# Patient Record
Sex: Female | Born: 1950
Health system: Southern US, Community
[De-identification: ages and names within clinical notes are randomized; demographics above are authoritative.]

## PROBLEM LIST (undated history)

## (undated) DIAGNOSIS — K219 Gastro-esophageal reflux disease without esophagitis: Secondary | ICD-10-CM

## (undated) HISTORY — DX: Gastro-esophageal reflux disease without esophagitis: K21.9

---

## 2002-12-07 ENCOUNTER — Other Ambulatory Visit: Admission: RE | Admit: 2002-12-07 | Discharge: 2002-12-07 | Payer: Self-pay | Admitting: Internal Medicine

## 2010-06-02 ENCOUNTER — Ambulatory Visit: Payer: Self-pay | Admitting: Family Medicine

## 2010-06-02 ENCOUNTER — Encounter: Payer: Self-pay | Admitting: Family Medicine

## 2010-06-02 DIAGNOSIS — R0789 Other chest pain: Secondary | ICD-10-CM | POA: Insufficient documentation

## 2010-06-02 DIAGNOSIS — K219 Gastro-esophageal reflux disease without esophagitis: Secondary | ICD-10-CM

## 2010-06-02 DIAGNOSIS — R21 Rash and other nonspecific skin eruption: Secondary | ICD-10-CM

## 2010-06-02 DIAGNOSIS — R0602 Shortness of breath: Secondary | ICD-10-CM

## 2010-06-02 DIAGNOSIS — Z780 Asymptomatic menopausal state: Secondary | ICD-10-CM | POA: Insufficient documentation

## 2010-06-02 LAB — CONVERTED CEMR LAB
Blood in Urine, dipstick: NEGATIVE
Glucose, Urine, Semiquant: NEGATIVE
Ketones, urine, test strip: NEGATIVE
Specific Gravity, Urine: 1.025
pH: 5

## 2010-06-04 ENCOUNTER — Telehealth (INDEPENDENT_AMBULATORY_CARE_PROVIDER_SITE_OTHER): Payer: Self-pay | Admitting: *Deleted

## 2010-06-04 LAB — CONVERTED CEMR LAB
Albumin: 4.4 g/dL (ref 3.5–5.2)
Alkaline Phosphatase: 66 units/L (ref 39–117)
Anti Nuclear Antibody(ANA): NEGATIVE
Basophils Absolute: 0 10*3/uL (ref 0.0–0.1)
CO2: 28 meq/L (ref 19–32)
Calcium: 9.8 mg/dL (ref 8.4–10.5)
Creatinine, Ser: 0.7 mg/dL (ref 0.4–1.2)
Eosinophils Absolute: 0.2 10*3/uL (ref 0.0–0.7)
Glucose, Bld: 96 mg/dL (ref 70–99)
HDL: 53.4 mg/dL (ref >39.00)
Hemoglobin: 12.8 g/dL (ref 12.0–15.0)
Hgb A1c MFr Bld: 6.2 % (ref 4.6–6.5)
Lymphocytes Relative: 34.1 % (ref 12.0–46.0)
MCHC: 34.5 g/dL (ref 30.0–36.0)
MCV: 87.5 fL (ref 78.0–100.0)
Monocytes Absolute: 0.5 10*3/uL (ref 0.1–1.0)
Neutro Abs: 4.7 10*3/uL (ref 1.4–7.7)
RDW: 13.8 % (ref 11.5–14.6)
Sed Rate: 4 mm/hr (ref 0–22)
Sodium: 141 meq/L (ref 135–145)
TSH: 1.63 microintl units/mL (ref 0.35–5.50)
Triglycerides: 101 mg/dL (ref 0.0–149.0)

## 2010-06-10 ENCOUNTER — Encounter: Payer: Self-pay | Admitting: Family Medicine

## 2010-06-10 ENCOUNTER — Encounter: Admission: RE | Admit: 2010-06-10 | Discharge: 2010-06-10 | Payer: Self-pay | Admitting: Family Medicine

## 2010-06-29 ENCOUNTER — Encounter: Payer: Self-pay | Admitting: Family Medicine

## 2010-06-30 ENCOUNTER — Telehealth (INDEPENDENT_AMBULATORY_CARE_PROVIDER_SITE_OTHER): Payer: Self-pay | Admitting: *Deleted

## 2010-07-03 ENCOUNTER — Ambulatory Visit: Payer: Self-pay | Admitting: Family Medicine

## 2010-07-03 DIAGNOSIS — R030 Elevated blood-pressure reading, without diagnosis of hypertension: Secondary | ICD-10-CM

## 2010-07-03 DIAGNOSIS — E785 Hyperlipidemia, unspecified: Secondary | ICD-10-CM | POA: Insufficient documentation

## 2010-07-09 ENCOUNTER — Encounter: Payer: Self-pay | Admitting: Family Medicine

## 2010-07-09 ENCOUNTER — Ambulatory Visit: Payer: Self-pay | Admitting: Family Medicine

## 2010-07-10 ENCOUNTER — Encounter: Payer: Self-pay | Admitting: Family Medicine

## 2010-07-17 ENCOUNTER — Telehealth (INDEPENDENT_AMBULATORY_CARE_PROVIDER_SITE_OTHER): Payer: Self-pay | Admitting: *Deleted

## 2010-08-14 ENCOUNTER — Encounter: Payer: Self-pay | Admitting: Family Medicine

## 2010-09-01 NOTE — Assessment & Plan Note (Signed)
Summary: Review [facility]//KP   Vital Signs:  Patient profile:   60 year old female Menstrual status:  postmenopausal Weight:      196.9 pounds Pulse rate:   80 / minute Pulse rhythm:   regular BP sitting:   120 / 88  (left arm) Cuff size:   large  Vitals Entered By: Almeta Monas CMA Duncan Dull) (July 03, 2010 3:10 PM) CC: Review [facility]   History of Present Illness: Pt here to review labs.    Current Medications (verified): 1)  None  Allergies (verified): No Known Drug Allergies  Physical Exam  General:  Well-developed,well-nourished,in no acute distress; alert,appropriate and cooperative throughout examination Psych:  Cognition and judgment appear intact. Alert and cooperative with normal attention span and concentration. No apparent delusions, illusions, hallucinations   Impression & Recommendations:  Problem # 1:  HYPERLIPIDEMIA (ICD-272.4) see [facility] diet and exercise---recheck 3-6 months Labs Reviewed: SGOT: 29 (06/02/2010)   SGPT: 35 (06/02/2010)   HDL:53.40 (06/02/2010)  LDL:124 (06/02/2010)  Chol:198 (06/02/2010)  Trig:101.0 (06/02/2010)  Problem # 2:  Family Hx of CARNITINE DEFIC DUE INBORN ERRORS METABOLISM (ICD-277.82)  Orders: Venipuncture (28413) T- * Misc. Laboratory test [phone]) T- * Misc. Laboratory test [phone]) T- * Misc. Laboratory test [phone])  Problem # 3:  ELEVATED BLOOD PRESSURE WITHOUT DIAGNOSIS OF HYPERTENSION (ICD-796.2)  BP today: 120/88 Prior BP: 114/76 (06/02/2010)  Labs Reviewed: Creat: 0.7 (06/02/2010) Chol: 198 (06/02/2010)   HDL: 53.40 (06/02/2010)   LDL: 124 (06/02/2010)   TG: 101.0 (06/02/2010)  Instructed in low sodium diet (DASH Handout) and behavior modification.    Patient Instructions: 1)  Please schedule a follow-up appointment in 2 weeks.    Orders Added: 1)  Venipuncture [36415] 2)  T- * Misc. Laboratory test [99999] 3)  T- * Misc. Laboratory test [99999] 4)  T- * Misc. Laboratory  test [99999] 5)  Est. Patient Level III [99213]  Appended Document: Orders Update    Clinical Lists Changes  Orders: Added new Test order of T-Vitamin D (25-Hydroxy) [phone]) - Signed

## 2010-09-01 NOTE — Progress Notes (Signed)
Summary: Results 11/3  Phone Note Outgoing Call   Call placed by: Almeta Monas CMA Duncan Dull),  June 04, 2010 9:45 AM Call placed to: Patient Details for Reason: Results Summary of Call: negative for Lupus When [facility] come in we will have pt come in to discuss results......  Left message to call back.........................................Marland KitchenAlmeta Monas CMA Duncan Dull)  June 04, 2010 9:44 AM  Gave pt the results and advise we will schedule her to review [facility] when they come back, Pt voiced understanding.... Almeta Monas CMA Duncan Dull)  June 05, 2010 9:18 AM

## 2010-09-01 NOTE — Assessment & Plan Note (Signed)
Summary: new to est//requesting cpx//lch   Vital Signs:  Patient profile:   60 year old female Menstrual status:  postmenopausal Height:      61.75 inches Weight:      199.0 pounds BMI:     36.83 Temp:     97.0 degrees F oral Pulse rate:   80 / minute Pulse rhythm:   regular BP sitting:   114 / 76  (left arm) Cuff size:   large  Vitals Entered By: Almeta Monas CMA Duncan Dull) (June 02, 2010 10:12 AM) CC: New Est Care/Fasting Declined flu shot and Tdap---Burning sesnsation across ther chest Is Patient Diabetic? No     Menstrual Status postmenopausal   History of Present Illness: Pt here to establish and c/o pinpoint burning sensation across chest ---last episode was september.  She has not had anything since then.   Pt states she has not had any since she changed her diet.   Pt had some SOB with it but she has trouble breathing in the humid weather --it doesn't matter if she is sitting , standing or walking the humidity bothers her every summer.   Pt also c/o itchy face and ears- and PND.  Pt has tried claritin only---years ago.   Preventive Screening-Counseling & Management  Alcohol-Tobacco     Smoking Status: never  Caffeine-Diet-Exercise     Caffeine use/day: 2 cups per day     Does Patient Exercise: no      Drug Use:  no.    Current Medications (verified): 1)  None  Allergies (verified): No Known Drug Allergies  Past History:  Family History: Last updated: 06/02/2010 Family History Lung cancer-- Father (smoker) Family History of Cardiovascular disorder--Mother Family History Diabetes 1st degree relative Family History Hypertension  Social History: Last updated: 06/02/2010 Married Never Smoked Alcohol use-no Drug use-no Regular exercise-no  Risk Factors: Caffeine Use: 2 cups per day (06/02/2010) Exercise: no (06/02/2010)  Risk Factors: Smoking Status: never (06/02/2010)  Past Medical History: GERD  Family History: Reviewed history and no  changes required. Family History Lung cancer-- Father (smoker) Family History of Cardiovascular disorder--Mother Family History Diabetes 1st degree relative Family History Hypertension  Social History: Reviewed history and no changes required. Married Never Smoked Alcohol use-no Drug use-no Regular exercise-no Smoking Status:  never Drug Use:  no Does Patient Exercise:  no Caffeine use/day:  2 cups per day  Review of Systems      See HPI General:  Denies chills, fatigue, fever, loss of appetite, malaise, sleep disorder, sweats, weakness, and weight loss. Eyes:  Denies blurring, discharge, double vision, eye irritation, eye pain, halos, itching, light sensitivity, red eye, vision loss-1 eye, and vision loss-both eyes. ENT:  Denies decreased hearing, difficulty swallowing, ear discharge, earache, hoarseness, nasal congestion, nosebleeds, postnasal drainage, ringing in ears, sinus pressure, and sore throat; itchy ears. CV:  Denies bluish discoloration of lips or nails, chest pain or discomfort, difficulty breathing at night, difficulty breathing while lying down, fainting, fatigue, leg cramps with exertion, lightheadness, near fainting, palpitations, shortness of breath with exertion, swelling of feet, swelling of hands, and weight gain. Resp:  Complains of shortness of breath; denies chest discomfort, chest pain with inspiration, cough, coughing up blood, excessive snoring, hypersomnolence, morning headaches, pleuritic, sputum productive, and wheezing. GI:  Denies abdominal pain, bloody stools, change in bowel habits, constipation, dark tarry stools, diarrhea, excessive appetite, gas, hemorrhoids, indigestion, loss of appetite, nausea, vomiting, vomiting blood, and yellowish skin color. GU:  Denies abnormal vaginal bleeding,  decreased libido, discharge, dysuria, genital sores, hematuria, incontinence, nocturia, urinary frequency, and urinary hesitancy. MS:  Denies joint pain, joint redness,  joint swelling, loss of strength, low back pain, mid back pain, muscle aches, muscle , cramps, muscle weakness, stiffness, and thoracic pain. Derm:  Complains of changes in color of skin and itching; denies changes in nail beds, dryness, excessive perspiration, flushing, hair loss, insect bite(s), lesion(s), poor wound healing, and rash. Neuro:  Denies brief paralysis, difficulty with concentration, disturbances in coordination, falling down, headaches, inability to speak, memory loss, numbness, poor balance, seizures, sensation of room spinning, tingling, tremors, visual disturbances, and weakness. Psych:  Denies alternate hallucination ( auditory/visual), anxiety, depression, easily angered, easily tearful, irritability, mental problems, panic attacks, sense of great danger, suicidal thoughts/plans, thoughts of violence, unusual visions or sounds, and thoughts /plans of harming others. Endo:  Denies cold intolerance, excessive hunger, excessive thirst, excessive urination, heat intolerance, polyuria, and weight change. Heme:  Denies abnormal bruising, bleeding, enlarge lymph nodes, fevers, pallor, and skin discoloration. Allergy:  Denies hives or rash, itching eyes, persistent infections, seasonal allergies, and sneezing.  Physical Exam  General:  Well-developed,well-nourished,in no acute distress; alert,appropriate and cooperative throughout examinationoverweight-appearing.   Head:  Normocephalic and atraumatic without obvious abnormalities. No apparent alopecia or balding. Eyes:  pupils equal, pupils round, and pupils reactive to light.   Ears:  External ear exam shows no significant lesions or deformities.  Otoscopic examination reveals clear canals, tympanic membranes are intact bilaterally without bulging, retraction, inflammation or discharge. Hearing is grossly normal bilaterally. Nose:  External nasal examination shows no deformity or inflammation. Nasal mucosa are pink and moist without  lesions or exudates. Mouth:  Oral mucosa and oropharynx without lesions or exudates.  Teeth in good repair. Neck:  No deformities, masses, or tenderness noted. Lungs:  Normal respiratory effort, chest expands symmetrically. Lungs are clear to auscultation, no crackles or wheezes. Heart:  normal rate and no murmur.   Extremities:  No clubbing, cyanosis, edema, or deformity noted with normal full range of motion of all joints.   Neurologic:  alert & oriented X3 and gait normal.   Skin:  face red + papules forehead, cheeks and chin mult SK Psych:  Cognition and judgment appear intact. Alert and cooperative with normal attention span and concentration. No apparent delusions, illusions, hallucinations   Impression & Recommendations:  Problem # 1:  SHORTNESS OF BREATH (ICD-786.05) PFT normal pt states breathing normal now--rto when weather gets warmer or symptoms start to reevaluate--- suspect pt has allergic component---secondary to itchy ears and drainage Orders: Venipuncture (44034) TLB-BMP (Basic Metabolic Panel-BMET) (80048-METABOL) TLB-CBC Platelet - w/Differential (85025-CBCD) TLB-Hepatic/Liver Function Pnl (80076-HEPATIC) TLB-TSH (Thyroid Stimulating Hormone) (84443-TSH) TLB-A1C / Hgb A1C (Glycohemoglobin) (83036-A1C) EKG w/ Interpretation (93000) Spirometry w/Graph (94010)  Problem # 2:  CHEST PAIN, ATYPICAL (ICD-786.59) none since Sept and change in diet check labs , rto prn Orders: Venipuncture (74259) TLB-BMP (Basic Metabolic Panel-BMET) (80048-METABOL) TLB-CBC Platelet - w/Differential (85025-CBCD) TLB-Hepatic/Liver Function Pnl (80076-HEPATIC) TLB-TSH (Thyroid Stimulating Hormone) (84443-TSH) TLB-A1C / Hgb A1C (Glycohemoglobin) (83036-A1C) EKG w/ Interpretation (93000) Spirometry w/Graph (94010)  Problem # 3:  FACIAL RASH (ICD-782.1) pt did not want to try any abx or lotions--she will wait until she sees derm  Orders: Dermatology Referral (Derma) T-Antinuclear  Antib (ANA) [phone]) TLB-Sedimentation Rate (ESR) (85652-ESR) EKG w/ Interpretation (93000) Spirometry w/Graph (94010)  Discussed medication use and symptom control.   Other Orders: T- * Misc. Laboratory test [phone]) Radiology Referral (Radiology)  Patient Instructions: 1)  try zyrtec daily 2)  rto if symptoms return   Orders Added: 1)  Venipuncture [36415] 2)  TLB-BMP (Basic Metabolic Panel-BMET) [80048-METABOL] 3)  TLB-CBC Platelet - w/Differential [85025-CBCD] 4)  TLB-Hepatic/Liver Function Pnl [80076-HEPATIC] 5)  TLB-TSH (Thyroid Stimulating Hormone) [84443-TSH] 6)  TLB-A1C / Hgb A1C (Glycohemoglobin) [83036-A1C] 7)  Dermatology Referral [Derma] 8)  T-Antinuclear Antib (ANA) [[address]] 9)  TLB-Sedimentation Rate (ESR) [85652-ESR] 10)  T- * Misc. Laboratory test [phone] 11)  Radiology Referral [Radiology] 12)  New Patient Level III [99203] 13)  EKG w/ Interpretation [93000] 14)  Spirometry w/Graph [94010]     Laboratory Results   Urine Tests   Date/Time Reported: June 02, 2010 12:20 PM  Routine Urinalysis   Color: yellow Appearance: Clear Glucose: negative   (Normal Range: Negative) Bilirubin: negative   (Normal Range: Negative) Ketone: negative   (Normal Range: Negative) Spec. Gravity: 1.025   (Normal Range: 1.003-1.035) Blood: negative   (Normal Range: Negative) pH: 5.0   (Normal Range: 5.0-8.0) Protein: negative   (Normal Range: Negative) Urobilinogen: 0.2   (Normal Range: 0-1) Nitrite: negative   (Normal Range: Negative) Leukocyte Esterace: negative   (Normal Range: Negative)

## 2010-09-01 NOTE — Progress Notes (Signed)
Summary: Needs appt 11/29  Phone Note Outgoing Call   Call placed by: Almeta Monas CMA Duncan Dull),  June 30, 2010 9:52 AM Call placed to: Patient Details for Reason: Needs Appt  Summary of Call: Pt needs appt to review [facility]... Left message to call back  Initial call taken by: Almeta Monas CMA Duncan Dull),  June 30, 2010 9:52 AM  Follow-up for Phone Call        appt scheduled.... Almeta Monas CMA Duncan Dull)  July 01, 2010 2:57 PM

## 2010-09-03 NOTE — Progress Notes (Signed)
Summary: Vit D results 12/16,12/19  Phone Note Outgoing Call   Call placed by: Almeta Monas CMA Duncan Dull),  July 17, 2010 4:25 PM Call placed to: Patient Details for Reason: Vitamin D results Summary of Call: Vit D low. pt will need 50,000 IU of Vit-D 1 by mouth wkly #4 with 2  rf and also take 2000 IU daily. recheck Vit D in 3 mos.   Left message to call back... Almeta Monas CMA Duncan Dull)  July 17, 2010 4:26 PM  Mailbox full, unable to leave mssg.... Almeta Monas CMA Duncan Dull)  July 20, 2010 3:56 PM   pt aware of the above and stated she got the order for the urine test... Rx called to [facility] in [place].  Almeta Monas CMA Duncan Dull)  July 21, 2010 4:34 PM'    New/Updated Medications: VITAMIN D (ERGOCALCIFEROL) 50000 UNIT CAPS (ERGOCALCIFEROL) 1 by mouth weekly x4 weeks Prescriptions: VITAMIN D (ERGOCALCIFEROL) 50000 UNIT CAPS (ERGOCALCIFEROL) 1 by mouth weekly x4 weeks  #4 x 2   Entered by:   Almeta Monas CMA (AAMA)   Authorized by:   Loreen Freud DO   Signed by:   Almeta Monas CMA (AAMA) on 07/21/2010   Method used:   Electronically to        [facility]* (retail)       [address] [place], [place]  [address]       Ph: [phone]       Fax: [phone]   RxID:   [id number]

## 2010-09-03 NOTE — Assessment & Plan Note (Signed)
Summary: BP CHECK/KN  Nurse Visit   Vital Signs:  Patient profile:   60 year old female Menstrual status:  postmenopausal Height:      61.75 inches Weight:      194 pounds BMI:     35.90 Temp:     97.5 degrees F oral Pulse rate:   82 / minute BP sitting:   118 / 82  (left arm)  Vitals Entered By: Jeremy Johann CMA (July 09, 2010 3:59 PM) CC: BP Check   Current Medications (verified): 1)  None  Allergies (verified): No Known Drug Allergies  Appended Document: Orders Update    Clinical Lists Changes  Orders: Added new Service order of Est. Patient Level I ([address]) - Signed

## 2010-09-03 NOTE — Consult Note (Signed)
Summary: Duard Larsen MD Dermatology  Duard Larsen MD Dermatology   Imported By: Lanelle Bal 07/23/2010 09:28:17  _____________________________________________________________________  External Attachment:    Type:   Image     Comment:   External Document

## 2011-02-07 ENCOUNTER — Encounter: Payer: Self-pay | Admitting: Family Medicine

## 2011-02-12 ENCOUNTER — Ambulatory Visit (INDEPENDENT_AMBULATORY_CARE_PROVIDER_SITE_OTHER): Payer: Managed Care, Other (non HMO) | Admitting: Family Medicine

## 2011-02-12 ENCOUNTER — Encounter: Payer: Self-pay | Admitting: Family Medicine

## 2011-02-12 DIAGNOSIS — R1011 Right upper quadrant pain: Secondary | ICD-10-CM

## 2011-02-12 DIAGNOSIS — L719 Rosacea, unspecified: Secondary | ICD-10-CM | POA: Insufficient documentation

## 2011-02-12 DIAGNOSIS — Z1211 Encounter for screening for malignant neoplasm of colon: Secondary | ICD-10-CM

## 2011-02-12 DIAGNOSIS — R0602 Shortness of breath: Secondary | ICD-10-CM

## 2011-02-12 NOTE — Progress Notes (Signed)
Subjective:    Patient ID: Gina Norton, female    DOB: May 25, 1951, 60 y.o.   MRN: [id number]  HPI  She had labs done last Nov, 2011 and sent to [facility]. She would like those redrawn. I did go into her old records and reviewed her results. Her insulin levels are high. She notes she reallly changed her diet and she has lost 20 lbs since then. She had several excusses for why she is not exercising but is frustrated that her weight has plateued.    Has right sided pain that started during pregnancy. Feels almost like a baby is kicking. Not painful.  Just uncomfortable. Happening more often. Has lost 20 lbs with dietary changes.  Tight clothes irritate it. No pain today.  Took Ibu - it completely resolves her sxs.    She complains of shortness of breath mostly when she's at [facility]. She says it will feel like she has a hard time breathing and she has mentally been without taking a breath in. She says at times she finishes her shopping she feels completely and utterly exhausted and feels like it messes with her breathing pattern. She she says it is alleviated when checked she walks out of the building where it is a little bit warmer and he gets even better when she sits in her car and was returned on the air-conditioning. She denies any prior history of allergies. She did state she had a breathing test which I assume a spirometry done at her previous physician's office which she reports was normal. There is no rhinitis sneezing or itching with these episodes. No cough, shortness of breath.  Review of Systems  Constitutional: Negative for fever, diaphoresis and unexpected weight change.  HENT: Negative for hearing loss, sneezing, postnasal drip and tinnitus.   Eyes: Negative for visual disturbance.  Respiratory: Negative for cough and wheezing.   Cardiovascular: Negative for chest pain and palpitations.  Gastrointestinal: Negative for nausea, vomiting and blood in stool.  Genitourinary:  Negative for vaginal bleeding and vaginal discharge.  Musculoskeletal: Negative for arthralgias.  Skin: Negative for rash.  Neurological: Negative for headaches.  Hematological: Negative for adenopathy. Does not bruise/bleed easily.  Psychiatric/Behavioral: Negative for dysphoric mood. The patient is not nervous/anxious.    BP 121/80  Pulse 60  Ht 5\' 3"  (1.6 m)  Wt 180 lb (81.647 kg)  BMI 31.89 kg/m2  SpO2 94%    Not on File  Past Medical History  Diagnosis Date  . GERD (gastroesophageal reflux disease)     No past surgical history on file.  History   Social History  . Marital Status: Married    Spouse Name: Jesus     Number of Children: 4  . Years of Education: N/A   Occupational History  . Homemaker    Social History Main Topics  . Smoking status: Never Smoker   . Smokeless tobacco: Not on file  . Alcohol Use: No  . Drug Use: No  . Sexually Active: Yes -- Female partner(s)   Other Topics Concern  . Not on file   Social History Narrative   4 caffeine drink a day. Walks for exercise.     Family History  Problem Relation Age of Onset  . Lung cancer Father 40    smoker  . Heart disease Mother     family history  . Diabetes Maternal Grandmother   . Hypertension Maternal Grandfather   . Lung cancer Paternal Grandfather   . Thyroid cancer Son   .  Leukemia Grandchild     No current outpatient prescriptions on file.     Objective:   Physical Exam  Constitutional: She is oriented to person, place, and time. She appears well-developed and well-nourished.       Obese.   HENT:  Head: Normocephalic and atraumatic.  Cardiovascular: Normal rate, regular rhythm and normal heart sounds.   Pulmonary/Chest: Effort normal and breath sounds normal.  Neurological: She is alert and oriented to person, place, and time.  Skin: Skin is warm and dry.  Psychiatric: She has a normal mood and affect. Her behavior is normal.          Assessment & Plan:  Right sided  flank pain-Discussed not likely to be GB or liver since not really painful. If feels like moving then most likely cause with be her bowels. She has never had colonoscopy. We dicussed getting this scheduled. Otherwise unlikely to be a cancer, etc if has ben there for that many years.    SOB - Likely anxiety esp if has had normal spirometry. I expalined to her that it is strange that her breathing is better once leaves the store and step outside. Certainly some of the fumes frmo clothing, packaging and products may be irritating her but the only solutoin to that is to not shop there.  I really don't think this is allergic but can try an antihistamine before shopping and see if helps her . Can try claritin. She says her previous MD wanted her to see an allergies.

## 2011-02-12 NOTE — Patient Instructions (Signed)
We will call you when we are able to order the [place] test.

## 2011-02-17 ENCOUNTER — Telehealth: Payer: Self-pay | Admitting: Family Medicine

## 2011-02-17 NOTE — Telephone Encounter (Signed)
Patient walk-in request to know if she can have a [facility] test done Friday. Pt states that she spk with you 02/12/11 about having test done  And you were not sure where to order the test. Pt request to know if you can give her a lab order that she can take to Dr.Lowne's office to have done this Friday since she has to take her husband to an office visit near Dr. Ernst Spell office. Pt states can come pick up lab order tomorrow if someone will call her tomorrow afternoon and adv it is ready for pick up

## 2011-02-18 NOTE — Telephone Encounter (Signed)
Advised pt to stop in 7/20 to pick up Rx for test per Dr Judie Petit.

## 2011-02-18 NOTE — Telephone Encounter (Signed)
I don't have a way to put in formal labs order but I can write on a rx pad and she can pick up and take with her. Will leave up front for her.

## 2011-02-22 ENCOUNTER — Other Ambulatory Visit (INDEPENDENT_AMBULATORY_CARE_PROVIDER_SITE_OTHER): Payer: Managed Care, Other (non HMO)

## 2011-02-22 DIAGNOSIS — E785 Hyperlipidemia, unspecified: Secondary | ICD-10-CM

## 2011-02-22 NOTE — Progress Notes (Signed)
,  12  

## 2011-03-13 ENCOUNTER — Other Ambulatory Visit: Payer: Self-pay | Admitting: Family Medicine

## 2011-03-22 ENCOUNTER — Telehealth: Payer: Self-pay | Admitting: Family Medicine

## 2011-03-22 NOTE — Telephone Encounter (Signed)
Call pt: I have her test results from [facility]. She can make appt to review anytime.

## 2011-03-23 NOTE — Telephone Encounter (Signed)
Pt.notified

## 2011-04-01 ENCOUNTER — Ambulatory Visit (INDEPENDENT_AMBULATORY_CARE_PROVIDER_SITE_OTHER): Payer: Managed Care, Other (non HMO) | Admitting: Family Medicine

## 2011-04-01 ENCOUNTER — Encounter: Payer: Self-pay | Admitting: Family Medicine

## 2011-04-01 VITALS — BP 126/78 | HR 68 | Ht 63.0 in | Wt 182.0 lb

## 2011-04-01 DIAGNOSIS — Z1211 Encounter for screening for malignant neoplasm of colon: Secondary | ICD-10-CM

## 2011-04-01 NOTE — Progress Notes (Signed)
  Subjective:    Patient ID: Gina Norton, female    DOB: 1951/04/29, 60 y.o.   MRN: [id number]  HPI Has been working on diet. No regular exercise. SHe is here to review labs done through the [facility] program . She had these done last November.   We reviewed her results. She did have some improvement especially in her insulin score. It went from 25 down to 10. Her A1c also went down from 6.1-5.8. Her CRP went down from 7.2-6.6. Her LDL is still a little elevated at 124 and her triglycerides look good. Her HDL is still around 54. It actually did drop a little bit.   Review of Systems     Objective:   Physical Exam  Constitutional: She is oriented to person, place, and time. She appears well-developed and well-nourished.  Cardiovascular: Normal rate, regular rhythm and normal heart sounds.   Pulmonary/Chest: Effort normal and breath sounds normal.  Neurological: She is alert and oriented to person, place, and time.  Skin: Skin is warm and dry.  Psychiatric: She has a normal mood and affect.          Assessment & Plan:  We discussed the importance of colon cancer screening. She has never had this done. She is ok with referral. Her daughter is here with her today. She delcined the flu vaccine today.    We reviewed her lab results. Please see above. We discussed the importance of adding regular exercise in addition to her dietary changes. Certainly that has made a good stride in the right direction and if she were actually start exercising she could probably actually get her LDL down to 100 and increase her HDL. Also her CRP would probably improve mildly to I. suspect she'll probably be unlikely to get under 2. And she may be able to get her A1c which is now 5.8 under 5.7 which would put her into the normal range. Her liver function and kidney function were normal.

## 2011-04-01 NOTE — Patient Instructions (Signed)
We will call you with the referral. 

## 2011-04-26 ENCOUNTER — Encounter: Payer: Self-pay | Admitting: Family Medicine

## 2013-01-02 ENCOUNTER — Ambulatory Visit (INDEPENDENT_AMBULATORY_CARE_PROVIDER_SITE_OTHER): Payer: BC Managed Care – PPO | Admitting: Family Medicine

## 2013-01-02 ENCOUNTER — Ambulatory Visit (INDEPENDENT_AMBULATORY_CARE_PROVIDER_SITE_OTHER): Payer: BC Managed Care – PPO

## 2013-01-02 ENCOUNTER — Encounter: Payer: Self-pay | Admitting: Family Medicine

## 2013-01-02 VITALS — BP 128/88 | HR 76 | Ht 62.0 in | Wt 189.0 lb

## 2013-01-02 DIAGNOSIS — R0789 Other chest pain: Secondary | ICD-10-CM

## 2013-01-02 DIAGNOSIS — E785 Hyperlipidemia, unspecified: Secondary | ICD-10-CM

## 2013-01-02 DIAGNOSIS — E669 Obesity, unspecified: Secondary | ICD-10-CM

## 2013-01-02 DIAGNOSIS — R109 Unspecified abdominal pain: Secondary | ICD-10-CM

## 2013-01-02 DIAGNOSIS — Z8249 Family history of ischemic heart disease and other diseases of the circulatory system: Secondary | ICD-10-CM | POA: Insufficient documentation

## 2013-01-02 DIAGNOSIS — E039 Hypothyroidism, unspecified: Secondary | ICD-10-CM

## 2013-01-02 LAB — POCT URINALYSIS DIPSTICK
Bilirubin, UA: NEGATIVE
Blood, UA: NEGATIVE
Glucose, UA: NEGATIVE
Nitrite, UA: NEGATIVE
Urobilinogen, UA: 1

## 2013-01-02 NOTE — Progress Notes (Signed)
Subjective:    Patient ID: Gina Norton, female    DOB: 05-14-1951, 62 y.o.   MRN: [id number]  HPI She's not been seen in our office in over 3 years and she is here to establish care today. She does have some very specific concerns including chest pain and flank pain.  Had some burning on left side of chest pain over teh weekend. Her husband had a birthday this weekend and was under a lot of stress. Says it was a burning sensation. Says went into her armpit and maybe radiated into he neck. Says had similar sxs 3 years ago. Hasn't recurred. No nausea with it.  Some sweaty with it but says was hot in the house. Lasted 30 min. Says this felt different than her GERD. Mother had MI at age 66.  Never had a stress test.  No SOB with activity.    She's also noticed some right flank pain on and off for the last year. She says it seems to be worse if she leans to the right and sort of pinches the area. It is nontender to touch. But will ache at times. She denies any hematuria. She seems a bit aggravated by twisting motions. Sclera with rest. She denies any known injury to her back. No radiation into her buttocks or into her legs. No numbness or tingling in the legs. No prior history of kidney stones.  Review of Systems She also complains of some bilateral hip pain.  BP 128/88  Pulse 76  Ht 5\' 2"  (1.575 m)  Wt 189 lb (85.73 kg)  BMI 34.56 kg/m2    No Known Allergies  Past Medical History  Diagnosis Date  . GERD (gastroesophageal reflux disease)     No past surgical history on file.  History   Social History  . Marital Status: Married    Spouse Name: Jesus     Number of Children: 4  . Years of Education: N/A   Occupational History  . Homemaker    Social History Main Topics  . Smoking status: Never Smoker   . Smokeless tobacco: Not on file  . Alcohol Use: No  . Drug Use: No  . Sexually Active: Yes -- Female partner(s)   Other Topics Concern  . Not on file   Social History Narrative    4 caffeine drink a day. Walks for exercise.     Family History  Problem Relation Age of Onset  . Lung cancer Father 31    smoker  . Heart disease Mother     family history  . Diabetes Maternal Grandmother   . Hypertension Maternal Grandfather   . Lung cancer Paternal Grandfather   . Thyroid cancer Son   . Leukemia Grandchild     Outpatient Encounter Prescriptions as of 01/02/2013  Medication Sig Dispense Refill  . Cholecalciferol (D3-1000) 1000 UNITS capsule Take 2,000 Units by mouth daily.      Marland Kitchen doxycycline (ORACEA) 40 MG capsule Take 40 mg by mouth every morning.      . Magnesium 250 MG TABS Take 1 tablet by mouth.      . vitamin C (ASCORBIC ACID) 500 MG tablet Take 1,000 mg by mouth daily.       No facility-administered encounter medications on file as of 01/02/2013.          Objective:   Physical Exam  Constitutional: She is oriented to person, place, and time. She appears well-developed and well-nourished.  HENT:  Head: Normocephalic and  atraumatic.  Neck: Neck supple. No thyromegaly present.  Cardiovascular: Normal rate, regular rhythm and normal heart sounds.   No abdominal bruits.  Pulmonary/Chest: Effort normal and breath sounds normal.  Abdominal: Soft. Bowel sounds are normal. She exhibits no distension and no mass. There is tenderness. There is no rebound and no guarding.  Tender suprapubically  Musculoskeletal: She exhibits no edema.  Lymphadenopathy:    She has no cervical adenopathy.  Neurological: She is alert and oriented to person, place, and time.  Skin: Skin is warm and dry.  Psychiatric: She has a normal mood and affect. Her behavior is normal.          Assessment & Plan:   Atypical chest pain - consider cardiac causes. She does have a strong family history of heart disease that she is overall low risk for self. She does not have high blood pressure. She does have hyperlipidemia. No prior history of diabetes etc. certainly women can present  in a typical fashion. She says this definitely felt different from her typical reflux which is more mid chest and this was definitely to the left. EKG shows inverted T wave in lead 1. Right axis deviation. No acute ST-T wave changes. Poor R wave progression. Will get a chest x-ray as well as check a CK and troponin. Would also like to refer her for a treadmill stress test.  I would like to schedule her for a stress test. I would also like to get up-to-date blood work and look at her lipids and glucose.  Right flank pain-most likely musculoskeletal. Consider renal causes. We'll check a urinalysis today. Recommend a trial of ibuprofen or Tylenol as needed for pain relief to see if this helps.  Overdue for mammogram, Pap smear etc. Encouraged her to schedule physical for followup at some point in time. Next  Bilateral hip pain-I will be happy to see her back for this at a followup visit.

## 2013-01-02 NOTE — Addendum Note (Signed)
Addended by: Nani Gasser D on: 01/02/2013 05:39 PM   Modules accepted: Level of Service

## 2013-01-03 LAB — LIPID PANEL
Cholesterol: 193 mg/dL (ref 0–200)
Total CHOL/HDL Ratio: 4.3 Ratio
Triglycerides: 121 mg/dL (ref <150)
VLDL: 24 mg/dL (ref 0–40)

## 2013-01-03 LAB — COMPLETE METABOLIC PANEL WITH GFR
AST: 20 U/L (ref 0–37)
Alkaline Phosphatase: 60 U/L (ref 39–117)
BUN: 9 mg/dL (ref 6–23)
Creat: 0.71 mg/dL (ref 0.50–1.10)
GFR, Est Non African American: >89 mL/min
Glucose, Bld: 107 mg/dL — ABNORMAL HIGH (ref 70–99)
Total Bilirubin: 0.5 mg/dL (ref 0.3–1.2)

## 2013-01-03 LAB — CBC WITH DIFFERENTIAL/PLATELET
Basophils Relative: 0 % (ref 0–1)
Eosinophils Absolute: 0.2 10*3/uL (ref 0.0–0.7)
Eosinophils Relative: 2 % (ref 0–5)
MCH: 28.8 pg (ref 26.0–34.0)
MCHC: 34.6 g/dL (ref 30.0–36.0)
MCV: 83.2 fL (ref 78.0–100.0)
Monocytes Relative: 7 % (ref 3–12)
Neutrophils Relative %: 47 % (ref 43–77)
Platelets: 247 10*3/uL (ref 150–400)

## 2013-01-03 LAB — CK TOTAL AND CKMB (NOT AT ARMC): Total CK: 72 U/L (ref 7–177)

## 2013-01-03 LAB — TROPONIN I: Troponin I: 0.01 ng/mL (ref <0.06)

## 2013-01-03 LAB — TSH: TSH: 0.808 u[IU]/mL (ref 0.350–4.500)

## 2013-01-05 ENCOUNTER — Encounter: Payer: Self-pay | Admitting: *Deleted

## 2013-01-29 ENCOUNTER — Encounter: Payer: BC Managed Care – PPO | Admitting: Physician Assistant

## 2016-04-07 DIAGNOSIS — L718 Other rosacea: Secondary | ICD-10-CM | POA: Diagnosis not present

## 2016-07-12 ENCOUNTER — Ambulatory Visit (INDEPENDENT_AMBULATORY_CARE_PROVIDER_SITE_OTHER): Payer: Medicare Other

## 2016-07-12 ENCOUNTER — Ambulatory Visit (INDEPENDENT_AMBULATORY_CARE_PROVIDER_SITE_OTHER): Payer: Medicare Other | Admitting: Family Medicine

## 2016-07-12 VITALS — BP 123/76 | HR 80 | Wt 189.0 lb

## 2016-07-12 DIAGNOSIS — S99912A Unspecified injury of left ankle, initial encounter: Secondary | ICD-10-CM

## 2016-07-12 DIAGNOSIS — S99922A Unspecified injury of left foot, initial encounter: Secondary | ICD-10-CM | POA: Diagnosis not present

## 2016-07-12 DIAGNOSIS — M25572 Pain in left ankle and joints of left foot: Secondary | ICD-10-CM

## 2016-07-12 DIAGNOSIS — M79672 Pain in left foot: Secondary | ICD-10-CM | POA: Diagnosis not present

## 2016-07-12 NOTE — Patient Instructions (Addendum)
Thank you for coming in today. Use the cam walking boot as needed for pain.  Return for recheck in 2 weeks.   Ankle Sprain Introduction An ankle sprain is a stretch or tear in one of the tough tissues (ligaments) in your ankle. Follow these instructions at home:  Rest your ankle.  Take over-the-counter and prescription medicines only as told by your doctor.  For 2-3 days, keep your ankle higher than the level of your heart (elevated) as much as possible.  If directed, put ice on the area:  Put ice in a plastic bag.  Place a towel between your skin and the bag.  Leave the ice on for 20 minutes, 2-3 times a day.  If you were given a brace:  Wear it as told.  Take it off to shower or bathe.  Try not to move your ankle much, but wiggle your toes from time to time. This helps to prevent swelling.  If you were given an elastic bandage (dressing):  Take it off when you shower or bathe.  Try not to move your ankle much, but wiggle your toes from time to time. This helps to prevent swelling.  Adjust the bandage to make it more comfortable if it feels too tight.  Loosen the bandage if you lose feeling in your foot, your foot tingles, or your foot gets cold and blue.  If you have crutches, use them as told by your doctor. Continue to use them until you can walk without feeling pain in your ankle. Contact a doctor if:  Your bruises or swelling are quickly getting worse.  Your pain does not get better after you take medicine. Get help right away if:  You cannot feel your toes or foot.  Your toes or your foot looks blue.  You have very bad pain that gets worse. This information is not intended to replace advice given to you by your health care provider. Make sure you discuss any questions you have with your health care provider. Document Released: 01/05/2008 Document Revised: 12/25/2015 Document Reviewed: 02/18/2015  2017 Elsevier    Ankle Sprain, Phase I Rehab Ask  your health care provider which exercises are safe for you. Do exercises exactly as told by your health care provider and adjust them as directed. It is normal to feel mild stretching, pulling, tightness, or discomfort as you do these exercises, but you should stop right away if you feel sudden pain or your pain gets worse.Do not begin these exercises until told by your health care provider. Stretching and range of motion exercises These exercises warm up your muscles and joints and improve the movement and flexibility of your lower leg and ankle. These exercises also help to relieve pain and stiffness. Exercise A: Gastroc and soleus stretch 1. Sit on the floor with your left / right leg extended. 2. Loop a belt or towel around the ball of your left / right foot. The ball of your foot is on the walking surface, right under your toes. 3. Keep your left / right ankle and foot relaxed and keep your knee straight while you use the belt or towel to pull your foot toward you. You should feel a gentle stretch behind your calf or knee. 4. Hold this position for __________ seconds, then release to the starting position. Repeat the exercise with your knee bent. You can put a pillow or a rolled bath towel under your knee to support it. You should feel a stretch deep in  your calf or at your Achilles tendon. Repeat each stretch __________ times. Complete these stretches __________ times a day. Exercise B: Ankle alphabet 1. Sit with your left / right leg supported at the lower leg.  Do not rest your foot on anything.  Make sure your foot has room to move freely. 2. Think of your left / right foot as a paintbrush, and move your foot to trace each letter of the alphabet in the air. Keep your hip and knee still while you trace. Make the letters as large as you can without feeling discomfort. 3. Trace every letter from A to Z. Repeat __________ times. Complete this exercise __________ times a day. Strengthening  exercises These exercises build strength and endurance in your ankle and lower leg. Endurance is the ability to use your muscles for a long time, even after they get tired. Exercise C: Dorsiflexors 1. Secure a rubber exercise band or tube to an object, such as a table leg, that will stay still when the band is pulled. Secure the other end around your left / right foot. 2. Sit on the floor facing the object, with your left / right leg extended. The band or tube should be slightly tense when your foot is relaxed. 3. Slowly bring your foot toward you, pulling the band tighter. 4. Hold this position for __________ seconds. 5. Slowly return your foot to the starting position. Repeat __________ times. Complete this exercise __________ times a day. Exercise D: Plantar flexors 1. Sit on the floor with your left / right leg extended. 2. Loop a rubber exercise tube or band around the ball of your left / right foot. The ball of your foot is on the walking surface, right under your toes.  Hold the ends of the band or tube in your hands.  The band or tube should be slightly tense when your foot is relaxed. 3. Slowly point your foot and toes downward, pushing them away from you. 4. Hold this position for __________ seconds. 5. Slowly return your foot to the starting position. Repeat __________ times. Complete this exercise __________ times a day. Exercise E: Evertors 1. Sit on the floor with your legs straight out in front of you. 2. Loop a rubber exercise band or tube around the ball of your left / right foot. The ball of your foot is on the walking surface, right under your toes.  Hold the ends of the band in your hands, or secure the band to a stable object.  The band or tube should be slightly tense when your foot is relaxed. 3. Slowly push your foot outward, away from your other leg. 4. Hold this position for __________ seconds. 5. Slowly return your foot to the starting position. Repeat  __________ times. Complete this exercise __________ times a day. This information is not intended to replace advice given to you by your health care provider. Make sure you discuss any questions you have with your health care provider. Document Released: 02/17/2005 Document Revised: 03/25/2016 Document Reviewed: 06/02/2015 Elsevier Interactive Patient Education  2017 [employer].
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
## 2016-07-23 ENCOUNTER — Ambulatory Visit (INDEPENDENT_AMBULATORY_CARE_PROVIDER_SITE_OTHER): Payer: Medicare Other | Admitting: Family Medicine

## 2016-07-23 ENCOUNTER — Encounter: Payer: Self-pay | Admitting: Family Medicine

## 2016-07-23 VITALS — BP 134/82 | HR 78

## 2016-07-23 DIAGNOSIS — T148XXA Other injury of unspecified body region, initial encounter: Secondary | ICD-10-CM

## 2016-07-23 DIAGNOSIS — IMO0001 Reserved for inherently not codable concepts without codable children: Secondary | ICD-10-CM

## 2016-07-23 DIAGNOSIS — S93402A Sprain of unspecified ligament of left ankle, initial encounter: Secondary | ICD-10-CM | POA: Insufficient documentation

## 2016-07-23 DIAGNOSIS — S46812A Strain of other muscles, fascia and tendons at shoulder and upper arm level, left arm, initial encounter: Secondary | ICD-10-CM | POA: Diagnosis not present
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
## 2016-07-23 NOTE — Patient Instructions (Addendum)
Thank you for coming in today. Continue the exercises.  Attend PT.    How to Buddy Tape Introduction Buddy taping refers to taping an injured finger or toe to an uninjured finger or toe that is next to it. This protects the injured finger or toe and keeps it from moving while the injury heals. You may buddy tape a finger or toe if you have a minor sprain. Your health care provider may buddy tape your finger or toe if you have a sprain, dislocation, or fracture. You may be told to replace your buddy taping as needed. What are the risks? Generally, buddy taping is safe. However, problems may occur, such as:  Skin injury or infection.  Reduced blood flow to the finger or toe.  Skin reaction to the tape. Do not buddy tape your toe if you have diabetes. Do not buddy tape if you know that you have an allergy to adhesives or surgical tape. How to buddy tape Before Buddy Taping  Try to reduce any pain and swelling with rest, icing, and elevation:  Avoid any activity that causes pain.  Raise (elevate) your hand or foot above the level of your heart while you are sitting or lying down.  If directed, apply ice to the injured area:  Put ice in a plastic bag.  Place a towel between your skin and the bag.  Leave the ice on for 20 minutes, 2-3 times per day. Buddy Taping Procedure  Clean and dry your finger or toe as told by your health care provider.  Place a gauze pad or a piece of cloth or cotton between your injured finger or toe and the uninjured finger or toe.  Use tape to wrap around both fingers or toes so your injured finger or toe is secured to the uninjured finger or toe.  The tape should be snug, but not tight.  Make sure the ends of the piece of tape overlap.  Avoid placing tape directly over the joint.  Change the tape and the padding as told by your health care provider. Remove and replace the tape or padding if it becomes loose, worn, dirty, or wet. After Buddy  Taping  Take over-the-counter and prescription medicines only as told by your health care provider.  Return to your normal activities as told by your health care provider. Ask your health care provider what activities are safe for you.  Watch the buddy-taped area and always remove buddy taping if:  Your pain gets worse.  Your fingers turn pale or blue.  Your skin becomes irritated. Contact a health care provider if:  You have pain, swelling, or bruising that lasts longer than three days.  You have a fever.  Your skin is red, cracked, or irritated. Get help right away if:  The injured area becomes cold, numb, or pale.  You have severe pain, swelling, bruising, or loss of movement in your finger or toe.  Your finger or toe changes shape (deformity). This information is not intended to replace advice given to you by your health care provider. Make sure you discuss any questions you have with your health care provider. Document Released: 08/26/2004 Document Revised: 12/25/2015 Document Reviewed: 12/11/2014  2017 Elsevier    Ankle Sprain, Phase II Rehab Ask your health care provider which exercises are safe for you. Do exercises exactly as told by your health care provider and adjust them as directed. It is normal to feel mild stretching, pulling, tightness, or discomfort as you do  these exercises, but you should stop right away if you feel sudden pain or your pain gets worse.Do not begin these exercises until told by your health care provider. Stretching and range of motion exercises These exercises warm up your muscles and joints and improve the movement and flexibility of your lower leg and ankle. These exercises also help to relieve pain and stiffness. Exercise A: Gastroc stretch, standing 1. Stand with your hands against a wall. 2. Extend your left / right leg behind you, and bend your front knee slightly. Your heels should be on the floor. 3. Keeping your heels on the floor  and your back knee straight, shift your weight toward the wall. You should feel a gentle stretch in the back of your lower leg (calf). 4. Hold this position for __________ seconds. Repeat __________ times. Complete this exercise __________ times a day. Exercise B: Soleus stretch, standing 1. Stand with your hands against a wall. 2. Extend your left / right leg behind you, and bend your front knee slightly. Both of your heels should be on the floor. 3. Keeping your heels on the floor, bend your back knee and shift your weight slightly over your back leg. You should feel a gentle stretch deep in your calf. 4. Hold this position for __________ seconds. Repeat __________ times. Complete this exercise __________ times a day. Strengthening exercises These exercises build strength and endurance in your lower leg. Endurance is the ability to use your muscles for a long time, even after they get tired. Exercise C: Heel walking (dorsiflexion) Walk on your heels for __________ seconds or ___________ ft. Keep your toes as high as possible. Repeat __________ times. Complete this exercise __________ times a day. Balance exercises These exercises improve your balance and the reaction and control of your ankle to help improve stability. Exercise D: Multi-angle lunge 1. Stand with your feet together. 2. Take a step forward with your left / right leg, and shift your weight onto that leg. Your back heel will come off the floor, and your back toes will stay in place. 3. Push off your front leg to return your front foot to the starting position next to your other foot. 4. Repeat to the side, to the back, and any other directions as told by your health care provider. Repeat in each direction __________ times. Complete this exercise __________ times a day. Exercise E: Single leg stand 1. Without shoes, stand near a railing or in a door frame. Hold onto the railing or door frame as needed. 2. Stand on your left /  right foot. Keep your big toe down on the floor and try to keep your arch lifted. 3. Hold this position for __________ seconds. Repeat __________ times. Complete this exercise __________ times a day. If this exercise is too easy, you can try it with your eyes closed or while standing on a pillow. Exercise F: Inversion/eversion You will need a balance board for this exercise. Ask your health care provider where you can get a balance board or how you can make one. 1. Stand on a non-carpeted surface near a countertop or wall. 2. Step onto the balance board so your feet are hip-width apart. 3. Keep your feet in place and keep your upper body and hips steady. Using only your feet and ankles to move the board, do one or both of the following exercises as told by your health care provider:  Tip the board side to side as far as you can,  alternating between tipping to the left and tipping to the right. If you can, tip the board so it silently taps the floor. Do not let the board forcefully hit the floor. From time to time, pause to hold a steady position.  Tip the board side to side so the board does not hit the floor at all. From time to time, pause to hold a steady position. Repeat the movement for each exercise __________ times. Complete each exercise __________ times a day. Exercise G: Plantar flexion/dorsiflexion You will need a balance board for this exercise. Ask your health care provider where you can get a balance board or how you can make one. 1. Stand on a non-carpeted surface near a countertop or wall. 2. Step onto the balance board so your feet are hip-width apart. 3. Keep your feet in place and keep your upper body and hips steady. Using only your feet and ankles to move the board, do one or both of the following exercises as told by your health care provider:  Tip the board forward and backward so the board silently taps the floor. Do not let the board forcefully hit the floor. From time to  time, pause to hold a steady position.  Tip the board forward and backward so the board does not hit the floor at all. From time to time, pause to hold a steady position. Repeat the movement for each exercise __________ times. Complete each exercise __________ times a day. This information is not intended to replace advice given to you by your health care provider. Make sure you discuss any questions you have with your health care provider. Document Released: 11/08/2005 Document Revised: 03/25/2016 Document Reviewed: 06/02/2015 Elsevier Interactive Patient Education  2017 [employer].

## 2016-08-05 ENCOUNTER — Ambulatory Visit: Payer: PRIVATE HEALTH INSURANCE | Admitting: Rehabilitative and Restorative Service Providers"

## 2017-09-05 DIAGNOSIS — L718 Other rosacea: Secondary | ICD-10-CM | POA: Diagnosis not present

## 2017-09-10 DIAGNOSIS — M5412 Radiculopathy, cervical region: Secondary | ICD-10-CM | POA: Diagnosis not present

## 2017-09-10 DIAGNOSIS — R202 Paresthesia of skin: Secondary | ICD-10-CM | POA: Diagnosis not present

## 2017-09-10 DIAGNOSIS — R079 Chest pain, unspecified: Secondary | ICD-10-CM | POA: Diagnosis not present

## 2017-09-10 DIAGNOSIS — R9431 Abnormal electrocardiogram [ECG] [EKG]: Secondary | ICD-10-CM | POA: Diagnosis not present

## 2017-09-10 DIAGNOSIS — G459 Transient cerebral ischemic attack, unspecified: Secondary | ICD-10-CM | POA: Diagnosis not present

## 2017-10-25 ENCOUNTER — Encounter: Payer: Self-pay | Admitting: Family Medicine

## 2017-10-25 ENCOUNTER — Ambulatory Visit (INDEPENDENT_AMBULATORY_CARE_PROVIDER_SITE_OTHER): Payer: Medicare Other | Admitting: Family Medicine

## 2017-10-25 VITALS — BP 115/65 | HR 63 | Ht 62.0 in | Wt 172.0 lb

## 2017-10-25 DIAGNOSIS — H9193 Unspecified hearing loss, bilateral: Secondary | ICD-10-CM

## 2017-10-25 DIAGNOSIS — L299 Pruritus, unspecified: Secondary | ICD-10-CM

## 2017-10-25 DIAGNOSIS — L719 Rosacea, unspecified: Secondary | ICD-10-CM | POA: Diagnosis not present

## 2017-10-25 DIAGNOSIS — R7301 Impaired fasting glucose: Secondary | ICD-10-CM | POA: Diagnosis not present

## 2017-10-25 DIAGNOSIS — E785 Hyperlipidemia, unspecified: Secondary | ICD-10-CM | POA: Diagnosis not present

## 2017-10-25 DIAGNOSIS — Z13220 Encounter for screening for lipoid disorders: Secondary | ICD-10-CM | POA: Diagnosis not present

## 2017-10-25 DIAGNOSIS — R140 Abdominal distension (gaseous): Secondary | ICD-10-CM

## 2017-10-25 DIAGNOSIS — Z8249 Family history of ischemic heart disease and other diseases of the circulatory system: Secondary | ICD-10-CM | POA: Diagnosis not present

## 2017-10-25 NOTE — Progress Notes (Signed)
Subjective:    CC: wants testing for IgE food panel.    HPI:  She has been bloating and would like to have to have food panel testing.  Was eating pineapple the other day and caused her ot have a sorethroat and got sores on her tongue.  Has a similar reaction to Honeydew melon.  She wants to be tested for other foods that that might just be causing some GI upset and discomfort versus those that necessarily are causing sore tongue and sore throat.  She feels like that could be contributing to some of her GI issues.  Rosacea- on doxy chronically for control of her rosacea.  It does help but she does not like the fact that it makes her  skin very sun sensitive.  She is wondering if there are other treatments that could be helpful but not have the increase in sensitivity.  She does try to wear hats and she does try to wear sunscreen but does not like a lot of the products in the sun screens and so plans on trying to make her own.  Would also like to have her screening labs done including cholesterol and a CMP, thyroid etc.  She also reports that she has not been hearing as well.  She is around children that are fairly loud but she is not quite hearing them well. No ear pain.  He also complains that her ears are frequently itchy.  No other cold symptoms.   Past medical history, Surgical history, Family history not pertinant except as noted below, Social history, Allergies, and medications have been entered into the medical record, reviewed, and corrections made.   Review of Systems: No fevers, chills, night sweats, weight loss, chest pain, or shortness of breath.  Comprehensive review of systems is otherwise negative except for what is noted in the HPI. Objective:    General: Well Developed, well nourished, and in no acute distress.  Neuro: Alert and oriented x3, extra-ocular muscles intact, sensation grossly intact.  HEENT: Normocephalic, atraumatic, oropharynx is clear.  TMs and canals are clear  bilaterally. Skin: Warm and dry, no rashes.  She does have some increased erythema on the face from her rosacea. Cardiac: Regular rate and rhythm, no murmurs rubs or gallops, no lower extremity edema.  Respiratory: Clear to auscultation bilaterally. Not using accessory muscles, speaking in full sentences. Abd: Soft, nontender, normal bowel sounds.  No hepatosplenomegaly.   Impression and Recommendations:    Bloating -we can certainly check for sensitivities to foods.  I explained the difference between IgG and IgE levels.  Also consider that she could have some lactose intolerance especially as she is getting older.  We can also do a test for celiac as well.  If everything looks reassuring then consider a trial of a probiotic if she is not Artie done so.  Check liver enzymes and thyroid as well.  Hearing loss-hearing screening done here in the office today was normal.  Be happy to refer her for more formal in depth hearing testing with an audiologist if she would like.  Hyperlipidemia-due to recheck lipid panel.  Ear itching -plan the ear itching most most commonly either from allergies or from dryness of the skin in the canal.  We discussed that removing too much wax can actually cause the ears to itch.  She will sometimes use alcohol to clean just the external part.  She could also consider a trial of an over-the-counter antihistamine and see if this is helpful  as well.  She has been taking vitamin D and encouraged her not to do so.  There are some study showing increased risk for heart disease in people taking supplementation with vitamin D.  Rosacea-unfortunately a lot of the other topicals including metronidazole still cause increased sun sensitivity and some the oral medications do so as well.  We encouraged her just to wear her hat if she just can be out for short period time but she will need some type of sunscreen if she is going to be out for an extended period of time.

## 2017-10-26 ENCOUNTER — Encounter: Payer: Self-pay | Admitting: Family Medicine

## 2017-10-26 DIAGNOSIS — R7301 Impaired fasting glucose: Secondary | ICD-10-CM | POA: Insufficient documentation

## 2017-10-29 LAB — ALLERGEN, COFFEE, RF221
Allergen, Coffee, Rf221: <0.1 kU/L
Class: 0

## 2017-10-29 LAB — COMPLETE METABOLIC PANEL WITH GFR
AG RATIO: 1.6 (calc) (ref 1.0–2.5)
ALKALINE PHOSPHATASE (APISO): 63 U/L (ref 33–130)
ALT: 12 U/L (ref 6–29)
AST: 16 U/L (ref 10–35)
Albumin: 4.5 g/dL (ref 3.6–5.1)
BILIRUBIN TOTAL: 0.6 mg/dL (ref 0.2–1.2)
BUN: 11 mg/dL (ref 7–25)
CHLORIDE: 107 mmol/L (ref 98–110)
CO2: 26 mmol/L (ref 20–32)
Calcium: 9.8 mg/dL (ref 8.6–10.4)
Creat: 0.63 mg/dL (ref 0.50–0.99)
GFR, Est African American: 108 mL/min/{1.73_m2} (ref >=60)
GFR, Est Non African American: 93 mL/min/{1.73_m2} (ref >=60)
GLUCOSE: 87 mg/dL (ref 65–99)
Globulin: 2.9 g/dL (calc) (ref 1.9–3.7)
POTASSIUM: 4.4 mmol/L (ref 3.5–5.3)
Sodium: 142 mmol/L (ref 135–146)
Total Protein: 7.4 g/dL (ref 6.1–8.1)

## 2017-10-29 LAB — HEMOGLOBIN A1C
Hgb A1c MFr Bld: 5.7 % of total Hgb — ABNORMAL HIGH (ref <5.7)
Mean Plasma Glucose: 117 (calc)
eAG (mmol/L): 6.5 (calc)

## 2017-10-29 LAB — LIPID PANEL
CHOLESTEROL: 230 mg/dL — AB (ref <200)
HDL: 52 mg/dL (ref >50)
LDL CHOLESTEROL (CALC): 151 mg/dL — AB
Non-HDL Cholesterol (Calc): 178 mg/dL (calc) — ABNORMAL HIGH (ref <130)
TRIGLYCERIDES: 145 mg/dL (ref <150)
Total CHOL/HDL Ratio: 4.4 (calc) (ref <5.0)

## 2017-10-29 LAB — FOOD SPECIFIC IGG ALLERGY( ADULT)
ALLERGEN EGG WHITE IGG: 12.8 ug/mL — AB (ref <2.0)
CASEIN IGE: 3.1 ug/mL — AB (ref <2.0)
COFFEE (F221) IGG: 2.4 ug/mL — ABNORMAL HIGH (ref <2.0)
Cacao (Chocolate)IgG: <2 ug/mL (ref <2.0)
Codfish/Scrod IgG: 2.5 ug/mL — ABNORMAL HIGH (ref <2.0)
Corn IgG: 7.7 ug/mL — ABNORMAL HIGH (ref <2.0)
PEANUT (F13) IGG: <2 ug/mL (ref <2.0)
Soybean IgG: <2 ug/mL (ref <2.0)
TOMATO IGG: 3 ug/mL — AB (ref <2.0)
Wheat IgG: 3.5 ug/mL — ABNORMAL HIGH (ref <2.0)
YEAST (F45) IGG: <2 ug/mL (ref <2.0)

## 2017-10-29 LAB — CBC
HEMATOCRIT: 43.5 % (ref 35.0–45.0)
HEMOGLOBIN: 14.9 g/dL (ref 11.7–15.5)
MCH: 28.7 pg (ref 27.0–33.0)
MCHC: 34.3 g/dL (ref 32.0–36.0)
MCV: 83.7 fL (ref 80.0–100.0)
MPV: 12.1 fL (ref 7.5–12.5)
Platelets: 243 10*3/uL (ref 140–400)
RBC: 5.2 10*6/uL — ABNORMAL HIGH (ref 3.80–5.10)
RDW: 13 % (ref 11.0–15.0)
WBC: 7.8 10*3/uL (ref 3.8–10.8)

## 2017-10-29 LAB — ALLERGEN MILK: Class: 0

## 2017-10-29 LAB — TISSUE TRANSGLUTAMINASE ABS,IGG,IGA
(tTG) Ab, IgA: 1 U/mL
(tTG) Ab, IgG: 3 U/mL

## 2017-10-29 LAB — TSH: TSH: 0.49 mIU/L (ref 0.40–4.50)

## 2017-10-29 LAB — INTERPRETATION:

## 2018-06-01 ENCOUNTER — Encounter: Payer: Self-pay | Admitting: Osteopathic Medicine

## 2018-06-01 ENCOUNTER — Ambulatory Visit (INDEPENDENT_AMBULATORY_CARE_PROVIDER_SITE_OTHER): Payer: Medicare Other

## 2018-06-01 ENCOUNTER — Ambulatory Visit (INDEPENDENT_AMBULATORY_CARE_PROVIDER_SITE_OTHER): Payer: Medicare Other | Admitting: Osteopathic Medicine

## 2018-06-01 VITALS — BP 112/74 | HR 65 | Temp 97.7°F | Wt 168.7 lb

## 2018-06-01 DIAGNOSIS — K9049 Malabsorption due to intolerance, not elsewhere classified: Secondary | ICD-10-CM

## 2018-06-01 DIAGNOSIS — M25571 Pain in right ankle and joints of right foot: Secondary | ICD-10-CM | POA: Diagnosis not present

## 2018-06-01 DIAGNOSIS — Z7189 Other specified counseling: Secondary | ICD-10-CM | POA: Diagnosis not present

## 2018-06-01 DIAGNOSIS — M25572 Pain in left ankle and joints of left foot: Secondary | ICD-10-CM

## 2018-06-01 DIAGNOSIS — M542 Cervicalgia: Secondary | ICD-10-CM

## 2018-06-01 DIAGNOSIS — G8929 Other chronic pain: Secondary | ICD-10-CM | POA: Diagnosis not present

## 2018-06-01 DIAGNOSIS — M47892 Other spondylosis, cervical region: Secondary | ICD-10-CM | POA: Diagnosis not present

## 2018-06-01 NOTE — Progress Notes (Signed)
HPI: Gina Norton is a 67 y.o. female who  has a past medical history of GERD (gastroesophageal reflux disease).  she presents to [facility] today, 06/01/18,  for chief complaint of:  Neck pain Ankle pain  Requests labs  Neck pain -ongoing issue for years, reports neck pain/soreness midline at base of skull.  No recent injury or exacerbation.  Requests referral to PT  Ankle pain -ongoing issue, bilateral ankle pain/"weakness" she was previously seen by Dr. Georgina Snell.  She requests referral to physical therapy, requests exercises for "weak ankles" -has been going to PT with her husband and speaking a bit with the therapists who recommended she come as a patient herself  Has concerns about food allergies and cardiovascular health as well as weight gain.  She requests testing for homocystine levels, food allergy panel to include fruit allergy testing, and saliva cortisol.  See below for assessment/plan.    Past medical history, surgical history, and family history reviewed.  Current medication list and allergy/intolerance information reviewed.   (See remainder of HPI, ROS, Phys Exam below)  Dg Cervical Spine 2 Or 3 Views  Result Date: 06/01/2018 CLINICAL DATA:  Chronic neck pain.  No known recent injury. EXAM: CERVICAL SPINE - 2-3 VIEW COMPARISON:  None. FINDINGS: Mild degenerative spondylosis at the C2-3 and C5-6 levels with associated disc space narrowings and mild osseous spurring. Additional degenerative uncovertebral joint hypertrophy bilaterally at multiple levels, mild to moderate in degree. Associated mild retrolisthesis of C3. No acute or suspicious osseous finding. Prevertebral soft tissues are normal in thickness. IMPRESSION: 1. Degenerative spondylosis of the cervical spine, mild to moderate in degree, as detailed above. 2. No acute findings. Electronically Signed   By: Franki Cabot M.D.   On: 06/01/2018 15:00        ASSESSMENT/PLAN: The  primary encounter diagnosis was Neck pain. Diagnoses of Chronic pain of both ankles, Food intolerance in adult, and Cardiac risk counseling were also pertinent to this visit.  Orders Placed This Encounter  Procedures  . DG Cervical Spine 2 or 3 views     . Allergen Profile, Food-Fruit  . Allergen Profile, Food-Citrus  . Allergen Panel, Food-Berry  . CBC  . Lipid panel  . CMP14+EGFR  . Homocysteine  . Ambulatory referral to Physical Therapy     Patient Instructions  Please note: Labs, particularly the specialized food allergen profiles, can be done at [facility].  Please note that Medicare may not pay for the homocystine levels as this is not considered a medically necessary test.   We will get x-ray today of neck.  Will get a referral in for physical therapy  Please schedule a follow-up with Dr. Madilyn Fireman to review lab results and discuss next steps if any abnormalities.    Follow-up plan: Return in about 1 week (around 06/08/2018) for recheck with PCP re: lab results, scheudle annual physical .                                 ############################################ ############################################ ############################################ ############################################    Outpatient Encounter Medications as of 06/01/2018  Medication Sig  . AMBULATORY NON FORMULARY MEDICATION Medication Name: chia seeds  . AMBULATORY NON FORMULARY MEDICATION Medication Name: flax seeds  . Ascorbic Acid (VITAMIN C) 1000 MG tablet Take 1,000 mg by mouth daily.  . B Complex Vitamins (VITAMIN B COMPLEX PO) Take by mouth.  . Cholecalciferol (VITAMIN D3) 1000  units CAPS Take 4,000 Int'l Units by mouth.  . CHOLINE PO Take 550 mg by mouth.  . Coenzyme Q10 (CO Q 10) 100 MG CAPS Take by mouth.  . doxycycline (VIBRAMYCIN) 100 MG capsule Take 100 mg by mouth daily.  Marland Kitchen FOLIC ACID PO Take by mouth.  Javier Docker Oil 500 MG CAPS Take 500 mg by  mouth.  . magnesium 30 MG tablet Take 30 mg by mouth 2 (two) times daily.  . Multiple Vitamins-Minerals (ZINC PO) Take by mouth.  . SPIRULINA PO Take 400 mg by mouth.  . vitamin E 400 UNIT capsule Take 400 Units by mouth daily.   No facility-administered encounter medications on file as of 06/01/2018.    Allergies  Allergen Reactions  . Other Other (See Comments)    Pineapple,honeydew: causes sore throat and sores on tongue      Review of Systems:  Constitutional: No recent illness  HEENT: No  headache, no vision change  Cardiac: No  chest pain, No  pressure, No palpitations  Respiratory:  No  shortness of breath. No  Cough  Gastrointestinal: No  abdominal pain, no change on bowel habits  Musculoskeletal: +myalgia/arthralgia  Skin: No  Rash  Hem/Onc: No  easy bruising/bleeding, No  abnormal lumps/bumps  Neurologic: No  weakness, No  Dizziness  Psychiatric: No  concerns with depression, No  concerns with anxiety  Exam:  BP 112/74 (BP Location: Left Arm, Patient Position: Sitting, Cuff Size: Large)   Pulse 65   Temp 97.7 F (36.5 C) (Oral)   Wt 168 lb 11.2 oz (76.5 kg)   BMI 30.86 kg/m   Constitutional: VS see above. General Appearance: alert, well-developed, well-nourished, NAD  Eyes: Normal lids and conjunctive, non-icteric sclera  Ears, Nose, Mouth, Throat: MMM, Normal external inspection ears/nares/mouth/lips/gums.  Neck: No masses, trachea midline.   Respiratory: Normal respiratory effort. no wheeze, no rhonchi, no rales  Cardiovascular: S1/S2 normal, no murmur, no rub/gallop auscultated. RRR.   Musculoskeletal: Gait normal. Symmetric and independent movement of all extremities  Neurological: Normal balance/coordination. No tremor.  Skin: warm, dry, intact.   Psychiatric: Normal judgment/insight. Normal mood and affect. Oriented x3.   Visit summary with medication list and pertinent instructions was printed for patient to review, advised to alert  [place] if any changes needed. All questions at time of visit were answered - patient instructed to contact office with any additional concerns. ER/RTC precautions were reviewed with the patient and understanding verbalized.   Follow-up plan: Return in about 1 week (around 06/08/2018) for recheck with PCP re: lab results, scheudle annual physical .  Note: Total time spent 25 minutes, greater than 50% of the visit was spent face-to-face counseling and coordinating care for the following: The primary encounter diagnosis was Neck pain. Diagnoses of Chronic pain of both ankles, Food intolerance in adult, and Cardiac risk counseling were also pertinent to this visit.Marland Kitchen  Please note: voice recognition software was used to produce this document, and typos may escape review. Please contact Dr. Sheppard Coil for any needed clarifications.

## 2018-06-01 NOTE — Patient Instructions (Addendum)
Please note: Labs, particularly the specialized food allergen profiles, can be done at [facility].  Please note that Medicare may not pay for the homocystine levels as this is not considered a medically necessary test.   We will get x-ray today of neck.  Will get a referral in for physical therapy  Please schedule a follow-up with Dr. Madilyn Fireman to review lab results and discuss next steps if any abnormalities.

## 2018-06-05 DIAGNOSIS — K9049 Malabsorption due to intolerance, not elsewhere classified: Secondary | ICD-10-CM | POA: Diagnosis not present

## 2018-06-05 DIAGNOSIS — G8929 Other chronic pain: Secondary | ICD-10-CM | POA: Diagnosis not present

## 2018-06-05 DIAGNOSIS — M25572 Pain in left ankle and joints of left foot: Secondary | ICD-10-CM | POA: Diagnosis not present

## 2018-06-05 DIAGNOSIS — Z7189 Other specified counseling: Secondary | ICD-10-CM | POA: Diagnosis not present

## 2018-06-05 DIAGNOSIS — M542 Cervicalgia: Secondary | ICD-10-CM | POA: Diagnosis not present

## 2018-06-05 DIAGNOSIS — M25571 Pain in right ankle and joints of right foot: Secondary | ICD-10-CM | POA: Diagnosis not present

## 2018-06-07 LAB — HOMOCYSTEINE: HOMOCYSTEINE: 10.8 umol/L (ref 0.0–15.0)

## 2018-06-07 LAB — CMP14+EGFR
ALBUMIN: 4.9 g/dL — AB (ref 3.6–4.8)
ALK PHOS: 67 IU/L (ref 39–117)
ALT: 20 IU/L (ref 0–32)
AST: 23 IU/L (ref 0–40)
Albumin/Globulin Ratio: 1.8 (ref 1.2–2.2)
BILIRUBIN TOTAL: 0.4 mg/dL (ref 0.0–1.2)
BUN / CREAT RATIO: 10 — AB (ref 12–28)
BUN: 7 mg/dL — ABNORMAL LOW (ref 8–27)
CO2: 23 mmol/L (ref 20–29)
CREATININE: 0.7 mg/dL (ref 0.57–1.00)
Calcium: 10.1 mg/dL (ref 8.7–10.3)
Chloride: 103 mmol/L (ref 96–106)
GFR calc non Af Amer: 90 mL/min/{1.73_m2} (ref >59)
GFR, EST AFRICAN AMERICAN: 104 mL/min/{1.73_m2} (ref >59)
GLOBULIN, TOTAL: 2.7 g/dL (ref 1.5–4.5)
Glucose: 99 mg/dL (ref 65–99)
Potassium: 4.5 mmol/L (ref 3.5–5.2)
SODIUM: 142 mmol/L (ref 134–144)
TOTAL PROTEIN: 7.6 g/dL (ref 6.0–8.5)

## 2018-06-07 LAB — ALLERGEN PANEL, FOOD-BERRY
Allergen Blueberry IgE: <0.1 kU/L
Allergen Strawberry IgE: <0.1 kU/L
F343-IgE Raspberry: <0.1 kU/L

## 2018-06-07 LAB — LIPID PANEL
CHOL/HDL RATIO: 3.7 ratio (ref 0.0–4.4)
Cholesterol, Total: 212 mg/dL — ABNORMAL HIGH (ref 100–199)
HDL: 58 mg/dL (ref >39)
LDL CALC: 131 mg/dL — AB (ref 0–99)
Triglycerides: 115 mg/dL (ref 0–149)
VLDL Cholesterol Cal: 23 mg/dL (ref 5–40)

## 2018-06-07 LAB — CBC
HEMOGLOBIN: 15.4 g/dL (ref 11.1–15.9)
Hematocrit: 46.5 % (ref 34.0–46.6)
MCH: 28.6 pg (ref 26.6–33.0)
MCHC: 33.1 g/dL (ref 31.5–35.7)
MCV: 86 fL (ref 79–97)
Platelets: 257 10*3/uL (ref 150–450)
RBC: 5.39 x10E6/uL — AB (ref 3.77–5.28)
RDW: 12.9 % (ref 12.3–15.4)
WBC: 6.6 10*3/uL (ref 3.4–10.8)

## 2018-06-07 LAB — ALLERGEN PROFILE, FOOD-CITRUS: Tangerine IgE: <0.1 kU/L

## 2018-06-07 LAB — ALLERGEN PROFILE, FOOD-FRUIT
Allergen Banana IgE: <0.1 kU/L
Allergen Grape IgE: <0.1 kU/L
Allergen Pear IgE: <0.1 kU/L
Allergen, Peach f95: <0.1 kU/L

## 2018-06-12 ENCOUNTER — Encounter: Payer: Self-pay | Admitting: Rehabilitative and Restorative Service Providers"

## 2018-06-12 ENCOUNTER — Ambulatory Visit (INDEPENDENT_AMBULATORY_CARE_PROVIDER_SITE_OTHER): Payer: Medicare Other | Admitting: Rehabilitative and Restorative Service Providers"

## 2018-06-12 DIAGNOSIS — M542 Cervicalgia: Secondary | ICD-10-CM

## 2018-06-12 DIAGNOSIS — R29898 Other symptoms and signs involving the musculoskeletal system: Secondary | ICD-10-CM | POA: Diagnosis not present

## 2018-06-12 DIAGNOSIS — M6281 Muscle weakness (generalized): Secondary | ICD-10-CM

## 2018-06-12 DIAGNOSIS — R293 Abnormal posture: Secondary | ICD-10-CM | POA: Diagnosis not present

## 2018-06-12 NOTE — Patient Instructions (Addendum)
Axial Extension (Chin Tuck) can do lying down with head supported or in sitting     Pull chin in and lengthen back of neck. Hold _10___ seconds while counting out loud. Repeat __5__ times. Do __several __ sessions per day.    SUPINE Tips A    Being in the supine position means to be lying on the back. Lying on the back is the position of least compression on the bones and discs of the spine, and helps to re-align the natural curves of the back. Lying with arms to side for 2-5 min for stretch across chest and in arms - bend elbow to release stretch then straighten again    Dorsiflexion: Resisted    Facing anchor, tubing around left foot, pull toward face.  Repeat __10__ times per set. Do _1-3___ sets per session. Do __1-2_ sessions per day.    Inversion: Resisted    Cross legs with right leg underneath, foot in tubing loop. Hold tubing around other foot to resist and turn foot in. Repeat __10__ times per set. Do _1-3___ sets per session. Do __1-2_ sessions per day.    Eversion: Resisted    With right foot in tubing loop, hold tubing around other foot to resist and turn foot out. Repeat _10___ times per set. Do __1-3__ sets per session. Do _1-2 sessions per day.   Heel Raise: Bilateral (Standing)    Rise on balls of feet. Repeat _10___ times per set. Do ___1-3_ sets per session. Do _2-3___ sessions per day.

## 2018-06-12 NOTE — Therapy (Signed)
[place], [place], [address] Phone: [phone]   Fax:  [phone]  Physical Therapy Evaluation  Patient Details  Name: Gina Norton MRN: [id number] Date of Birth: 1951/03/30 Referring Provider (PT): Dr Emeterio Reeve    Encounter Date: 06/12/2018  PT End of Session - 06/12/18 1249    Visit Number  1    Number of Visits  12    Date for PT Re-Evaluation  07/24/18    PT Start Time  1155    PT Stop Time  1253    PT Time Calculation (min)  58 min    Activity Tolerance  Patient tolerated treatment well       Past Medical History:  Diagnosis Date  . GERD (gastroesophageal reflux disease)     History reviewed. No pertinent surgical history.  There were no vitals filed for this visit.   Subjective Assessment - 06/12/18 1158    Subjective  Patient reports pain in neck in the spine and sometimes into the shoulders. She has had some pain over the past 5 yrs with symptoms increasing in the past 4-5 months. She does not know of any accident or injury. Patient also complains of weakness in both ankles. She feels that she has to be careful not to fall. She is concerned about the weakness especially going down stairs.     Pertinent History  arthritis; denies any medical problems     Patient Stated Goals  decrease neck pain and have more strength in her ankles     Currently in Pain?  Yes    Pain Score  2     Pain Location  Neck    Pain Orientation  Lower;Mid    Pain Descriptors / Indicators  Discomfort;Dull;Aching    Pain Type  Chronic pain    Pain Radiating Towards  sometimes up to shoulders sometimes to the back of head - rarely causes a headache     Pain Onset  More than a month ago    Pain Frequency  Constant    Aggravating Factors   head forward; falling asleep in the chair; awkward sleeping position in bed     Pain Relieving Factors  movement; massage          [facility] PT Assessment -  06/12/18 0001      Assessment   Medical Diagnosis  Cervical dysfunction; bilat ankle weakness     Referring Provider (PT)  Dr Emeterio Reeve     Onset Date/Surgical Date  03/02/18   neck pain for ~ 5 yrs worse in past 4-5 months    Hand Dominance  Right    Next MD Visit  12/19    Prior Therapy  none       Precautions   Precautions  None      Balance Screen   Has the patient fallen in the past 6 months  No    Has the patient had a decrease in activity level because of a fear of falling?   No    Is the patient reluctant to leave their home because of a fear of falling?   No      Home Film/video editor residence    Living Arrangements  Spouse/significant other    Home Access  Stairs to enter    Entrance Stairs-Number of Steps  8    Entrance Stairs-Rails  Can reach both    [place]  Two level    Alternate Level Stairs-Number of Steps  12    Alternate Level Stairs-Rails  Right;Left   split level      Prior Function   Level of Independence  Independent    Vocation  Other (comment)    Vocation Requirements  homemaker     Leisure  household chores; cooking; painting inside home this summer; Tree surgeon; iPad       Observation/Other Assessments   Focus on Therapeutic Outcomes (FOTO)   39% limitation       Sensation   Additional Comments  WFL's per pt report       Posture/Postural Control   Posture Comments  head forward; shoulders rounded and elevated; head of the humerus anterior in orientation; increased thoracic kyphosis      AROM   Right/Left Shoulder  --   end range tightness bilat shd elevation    Right/Left Hip  --   WFl's bilat    Right/Left Knee  --   WFL's bilat    Right/Left Ankle  --   WFL's except DF as noted    Right Ankle Dorsiflexion  4    Left Ankle Dorsiflexion  6    Cervical Flexion  41    Cervical Extension  46    Cervical - Right Side Bend  32    Cervical - Left Side Bend  30    Cervical - Right Rotation  51     Cervical - Left Rotation  62      Strength   Right/Left Shoulder  --   WFL's bilat except middle/lower trap 4/5    Right/Left Hip  --   grossly 5-/5 to 5/5 throughout    Right/Left Knee  --   5/5 bilat    Right Ankle Dorsiflexion  4/5    Right Ankle Plantar Flexion  4/5    Right Ankle Inversion  4+/5    Right Ankle Eversion  4-/5    Left Ankle Dorsiflexion  4/5    Left Ankle Plantar Flexion  4/5    Left Ankle Inversion  4+/5    Left Ankle Eversion  4/5      Flexibility   Hamstrings  WFL's bilat     ITB  WFL's bilat     Piriformis  tight Lt > Rt       Palpation   Spinal mobility  hypomobile upper thoracic and cervical spine     Palpation comment  muscular tightness bilat cervical and upper thoracic paraspinals; upper traps; leveators; pecs       Ambulation/Gait   Gait Comments  gait WFL's ambulates with LE's in ER       Balance   Balance Assessed  --   SLS <2- 3 sec bilat               Objective measurements completed on examination: See above findings.      [facility] Adult PT Treatment/Exercise - 06/12/18 0001      Shoulder Exercises: Stretch   Other Shoulder Stretches  supine snow angel 2-3 min arms at ~ 70-75 deg abd       Moist Heat Therapy   Number Minutes Moist Heat  15 Minutes    Moist Heat Location  Cervical   thoracic bilat      Electrical Stimulation   Electrical Stimulation Location  bilat cervical and upper thoracic paraspinals     Electrical Stimulation Action  IFC    Electrical Stimulation Parameters  to tolerance  Electrical Stimulation Goals  Pain;Tone      Neck Exercises: Stretches   Other Neck Stretches  axial extension supine 10 sec x 5 reps head supported on pillow       Ankle Exercises: Standing   Heel Raises  Both;10 reps      Ankle Exercises: Supine   T-Band  10 reps DF/Inv/Ever red TB              PT Education - 06/12/18 1246    Education Details  HEP     Person(s) Educated  Patient    Methods   Explanation;Demonstration;Tactile cues;Verbal cues;Handout    Comprehension  Verbalized understanding;Returned demonstration;Verbal cues required;Tactile cues required          PT Long Term Goals - 06/12/18 1324      PT LONG TERM GOAL #1   Title  Decrease cervical pain by 50-75% allowiing patient to perform normal functional activities with minimal to no pain and sleep without awakening due to pain 07/24/18    Time  6    Period  Weeks    Status  New      PT LONG TERM GOAL #2   Title  Increased cervical ROM by 3-5 degrees in bilat lateral flexion and rotation 07/24/18    Time  6    Period  Weeks    Status  New      PT LONG TERM GOAL #3   Title  Increase ankle pain to 5-/5 to 5/5 bilat 07/24/18    Time  6    Period  Weeks    Status  New      PT LONG TERM GOAL #4   Title  Independent in HEP 07/24/18    Time  6    Period  Weeks    Status  New      PT LONG TERM GOAL #5   Title  Improve FOTO to </= 28% limitation 07/24/18    Time  6    Period  Weeks    Status  New             Plan - 06/12/18 1250    Clinical Impression Statement  Gina Norton presents with 4-5 month history of increased cervical pain and stiffness. She has poor posture and alignment; limited cervical and UE mobility and ROM; muscular tightness to palpation; pain and limited ability to sleep and rest. She also has limited ankle ROM and decreased strenght bilat. Gina Norton will benefit from PT to address problems identified.     History and Personal Factors relevant to plan of care:  chronic cervical pain; sedentary lifestyle     Clinical Presentation  Stable    Clinical Decision Making  Moderate    Rehab Potential  Good    PT Frequency  2x / week    PT Duration  6 weeks    PT Treatment/Interventions  Patient/family education;ADLs/Self Care Home Management;Cryotherapy;Electrical Stimulation;Iontophoresis 4mg /ml Dexamethasone;Moist Heat;Ultrasound;Dry needling;Neuromuscular re-education;Therapeutic  activities;Therapeutic exercise;Balance training;Manual techniques    PT Next Visit Plan  review HEP; progress stretching and strengthening(pec stretch, gastroc/soleus stretch, posterior shoulder girdle strengthening; LE strengthening with specific attention to ankle strengthening); balance activities; manual work and modalities as indicated     Oncologist with Plan of Care  Patient       Patient will benefit from skilled therapeutic intervention in order to improve the following deficits and impairments:  Postural dysfunction, Improper body mechanics, Pain, Increased fascial restricitons, Increased muscle spasms, Hypomobility, Decreased mobility,  Decreased range of motion, Decreased strength, Decreased activity tolerance  Visit Diagnosis: Cervicalgia - Plan: PT plan of care cert/re-cert  Muscle weakness (generalized) - Plan: PT plan of care cert/re-cert  Abnormal posture - Plan: PT plan of care cert/re-cert  Other symptoms and signs involving the musculoskeletal system - Plan: PT plan of care cert/re-cert     Problem List Patient Active Problem List   Diagnosis Date Noted  . IFG (impaired fasting glucose) 10/26/2017  . First degree ankle sprain, left, initial encounter 07/23/2016  . Family history of premature coronary heart disease 01/02/2013  . Rosacea 02/12/2011  . Hyperlipidemia 07/03/2010  . ELEVATED BLOOD PRESSURE WITHOUT DIAGNOSIS OF HYPERTENSION 07/03/2010  . GERD 06/02/2010  . CHEST PAIN, ATYPICAL 06/02/2010  . POSTMENOPAUSAL STATUS 06/02/2010    Brylon Brenning Nilda Simmer PT, MPH  06/12/2018, 1:29 PM  [facility] [place], [place], [address] Phone: [phone]   Fax:  [phone]  Name: Gina Norton MRN: [id number] Date of Birth: 1950-09-14

## 2018-06-21 ENCOUNTER — Ambulatory Visit (INDEPENDENT_AMBULATORY_CARE_PROVIDER_SITE_OTHER): Payer: Medicare Other | Admitting: Physical Therapy

## 2018-06-21 ENCOUNTER — Encounter: Payer: Self-pay | Admitting: Physical Therapy

## 2018-06-21 DIAGNOSIS — R29898 Other symptoms and signs involving the musculoskeletal system: Secondary | ICD-10-CM | POA: Diagnosis not present

## 2018-06-21 DIAGNOSIS — M542 Cervicalgia: Secondary | ICD-10-CM | POA: Diagnosis not present

## 2018-06-21 DIAGNOSIS — R293 Abnormal posture: Secondary | ICD-10-CM | POA: Diagnosis not present

## 2018-06-21 DIAGNOSIS — M6281 Muscle weakness (generalized): Secondary | ICD-10-CM | POA: Diagnosis not present

## 2018-06-21 NOTE — Therapy (Signed)
[place], [place], [address] Phone: [phone]   Fax:  [phone]  Physical Therapy Treatment  Patient Details  Name: Gina Norton MRN: [id number] Date of Birth: 1951/01/10 Referring Provider (PT): Dr Emeterio Reeve    Encounter Date: 06/21/2018  PT End of Session - 06/21/18 1434    Visit Number  2    Number of Visits  12    Date for PT Re-Evaluation  07/24/18    PT Start Time  1430    PT Stop Time  1530    PT Time Calculation (min)  60 min       Past Medical History:  Diagnosis Date  . GERD (gastroesophageal reflux disease)     History reviewed. No pertinent surgical history.  There were no vitals filed for this visit.  Subjective Assessment - 06/21/18 1434    Subjective  "My neck is feeling better since Celyn worked on it."  She reports her ankles still bother her.  She has difficulty getting down stairs.      Patient Stated Goals  decrease neck pain and have more strength in her ankles     Currently in Pain?  No/denies    Pain Score  0-No pain         [facility] PT Assessment - 06/21/18 0001      Assessment   Medical Diagnosis  Cervical dysfunction; bilat ankle weakness     Referring Provider (PT)  Dr Emeterio Reeve     Onset Date/Surgical Date  03/02/18   neck pain for ~ 5 yrs worse in past 4-5 months    Hand Dominance  Right    Next MD Visit  12/19    Prior Therapy  none         [facility] Adult PT Treatment/Exercise - 06/21/18 0001      Exercises   Exercises  Shoulder;Neck;Ankle      Neck Exercises: Seated   Other Seated Exercise  L's and W's x 5 sec hold (per HEP) x 5 reps       Neck Exercises: Supine   Neck Retraction  10 reps;5 secs    Other Supine Exercise  L's with scap retraction x 5 sec x 10 reps (in supine)    Other Supine Exercise  prolonged snow angel then 10 active ones to tolerance       Shoulder Exercises: Supine   External Rotation  Both;10  reps;Theraband    Theraband Level (Shoulder External Rotation)  Level 2 (Red);Level 1 (Yellow)    External Rotation Limitations  red - neck began hurting, reduced to yellow band without pain.     Flexion  Strengthening;Both;10 reps;Theraband   overhead pull with red band     Moist Heat Therapy   Number Minutes Moist Heat  15 Minutes    Moist Heat Location  Cervical   pt seated, per request     Electrical Stimulation   Electrical Stimulation Location  bilat cervical and upper thoracic paraspinals     Electrical Stimulation Action  IFC    Electrical Stimulation Parameters   to tolerance    Electrical Stimulation Goals  Pain      Ankle Exercises: Aerobic   Nustep  L4: arms/legs (slow pace) x 6 mi n   PTA present to discuss progress     Ankle Exercises: Stretches   Gastroc Stretch  3 reps;20 seconds   2 reps runner stretch, 1 rep heel off step  Ankle Exercises: Seated   Heel Raises  Both;10 reps    Toe Raise  10 reps    Other Seated Ankle Exercises  bilat ankle eversion with red band x 8 reps, switched to unilateral for improved tolerance       Neck Exercises: Stretches   Upper Trapezius Stretch  Right;Left;2 reps;20 seconds      Ankle Exercises: Standing   SLS  Rt/Lt SLS with light occasional UE support to steady x 15-20 sec x 3 reps each side.     Heel Raises  Both;10 reps    Toe Raise  10 reps             PT Education - 06/21/18 1527    Education Details  HEP     Person(s) Educated  Patient    Methods  Explanation;Handout;Demonstration;Verbal cues;Tactile cues    Comprehension  Verbalized understanding;Returned demonstration          PT Long Term Goals - 06/12/18 1324      PT LONG TERM GOAL #1   Title  Decrease cervical pain by 50-75% allowiing patient to perform normal functional activities with minimal to no pain and sleep without awakening due to pain 07/24/18    Time  6    Period  Weeks    Status  New      PT LONG TERM GOAL #2   Title  Increased  cervical ROM by 3-5 degrees in bilat lateral flexion and rotation 07/24/18    Time  6    Period  Weeks    Status  New      PT LONG TERM GOAL #3   Title  Increase ankle pain to 5-/5 to 5/5 bilat 07/24/18    Time  6    Period  Weeks    Status  New      PT LONG TERM GOAL #4   Title  Independent in HEP 07/24/18    Time  6    Period  Weeks    Status  New      PT LONG TERM GOAL #5   Title  Improve FOTO to </= 28% limitation 07/24/18    Time  6    Period  Weeks    Status  New            Plan - 06/21/18 1521    Clinical Impression Statement  Pt had positive response with initial HEP, although was unable to perform ankle resistance exercises as given in HEP.  Pt issued alternative ankle exercises with improved form and tolerance.  Pt had difficulty tolerating supine shoulder/neck exercises with resistance; reported increased neck pain afterwards.  this pain was resolved with use of estim/ MHP at end of session.      Rehab Potential  Good    PT Frequency  2x / week    PT Duration  6 weeks    PT Treatment/Interventions  Patient/family education;ADLs/Self Care Home Management;Cryotherapy;Electrical Stimulation;Iontophoresis 4mg /ml Dexamethasone;Moist Heat;Ultrasound;Dry needling;Neuromuscular re-education;Therapeutic activities;Therapeutic exercise;Balance training;Manual techniques    PT Next Visit Plan  continue progressive stretching / strengthening of ankle and postural muscles; balance activities; manual work and modalities as indicated.     Consulted and Agree with Plan of Care  Patient       Patient will benefit from skilled therapeutic intervention in order to improve the following deficits and impairments:  Postural dysfunction, Improper body mechanics, Pain, Increased fascial restricitons, Increased muscle spasms, Hypomobility, Decreased mobility, Decreased range of motion, Decreased strength, Decreased  activity tolerance  Visit Diagnosis: Cervicalgia  Muscle weakness  (generalized)  Abnormal posture  Other symptoms and signs involving the musculoskeletal system     Problem List Patient Active Problem List   Diagnosis Date Noted  . IFG (impaired fasting glucose) 10/26/2017  . First degree ankle sprain, left, initial encounter 07/23/2016  . Family history of premature coronary heart disease 01/02/2013  . Rosacea 02/12/2011  . Hyperlipidemia 07/03/2010  . ELEVATED BLOOD PRESSURE WITHOUT DIAGNOSIS OF HYPERTENSION 07/03/2010  . GERD 06/02/2010  . CHEST PAIN, ATYPICAL 06/02/2010  . POSTMENOPAUSAL STATUS 06/02/2010   Kerin Perna, PTA 06/21/18 3:28 PM  [place], [place], [address] Phone: [phone]   Fax:  [phone]  Name: GIAH FICKETT MRN: [id number] Date of Birth: 1951-06-24

## 2018-06-21 NOTE — Patient Instructions (Signed)
ANKLE: Eversion, Unilateral (Band)    Place band around left foot. Keeping heel in place, raise toes of banded foot up and away from body. Do not move hip. Use __red__ band. __10_ reps per set, __2_ sets per day, _3-4__ days per week  Toe / Heel Raise (Sitting)    Sitting, raise heels, then rock back on heels and raise toes. Repeat __10__ times throughout day.   Shoulder Blade Squeeze    Rotate shoulders back, then squeeze shoulder blades together. Repeat ____ times. Do ____ sessions per day.  Upper Back Strength: Lower Trapezius / Rotator Cuff " L's "     Arms in waitress pose, palms up. Press hands back and slide shoulder blades down. Hold for __5__ seconds. Repeat _10___ times. 1-2 times per day.    Scapular Retraction: Elbow Flexion (Standing)  "W's" (this is a stick up position)    With elbows bent to 90, pinch shoulder blades together and rotate arms out, keeping elbows bent. Repeat __10__ times per set. Do __1-2__ sets per session. Do _several ___ sessions per day.   [facility] [place], [place] [address]  [phone] (office) [phone] (fax)

## 2018-06-23 ENCOUNTER — Encounter: Payer: Self-pay | Admitting: Physical Therapy

## 2018-06-23 ENCOUNTER — Ambulatory Visit (INDEPENDENT_AMBULATORY_CARE_PROVIDER_SITE_OTHER): Payer: Medicare Other | Admitting: Physical Therapy

## 2018-06-23 DIAGNOSIS — R293 Abnormal posture: Secondary | ICD-10-CM | POA: Diagnosis not present

## 2018-06-23 DIAGNOSIS — M6281 Muscle weakness (generalized): Secondary | ICD-10-CM | POA: Diagnosis not present

## 2018-06-23 DIAGNOSIS — M542 Cervicalgia: Secondary | ICD-10-CM

## 2018-06-23 DIAGNOSIS — R29898 Other symptoms and signs involving the musculoskeletal system: Secondary | ICD-10-CM

## 2018-06-23 NOTE — Therapy (Signed)
[place], [place], [address] Phone: [phone]   Fax:  [phone]  Physical Therapy Treatment  Patient Details  Name: Gina Norton MRN: [id number] Date of Birth: [birth year] Referring Provider (PT): Dr Emeterio Reeve    Encounter Date: 06/23/2018  PT End of Session - 06/23/18 1114    Visit Number  3    Number of Visits  12    Date for PT Re-Evaluation  07/24/18    PT Start Time  1110   pt arrived late   PT Stop Time  1200    PT Time Calculation (min)  50 min    Activity Tolerance  Patient tolerated treatment well       Past Medical History:  Diagnosis Date  . GERD (gastroesophageal reflux disease)     History reviewed. No pertinent surgical history.  There were no vitals filed for this visit.  Subjective Assessment - 06/23/18 1113    Subjective  Pt reports she is stressed about family coming into town this weekend.  She is trying to do her exercises, but has been busy cleaning her house.     Patient Stated Goals  decrease neck pain and have more strength in her ankles     Currently in Pain?  No/denies    Pain Score  0-No pain         [facility] PT Assessment - 06/23/18 0001      Assessment   Medical Diagnosis  Cervical dysfunction; bilat ankle weakness     Referring Provider (PT)  Dr Emeterio Reeve     Onset Date/Surgical Date  03/02/18   neck pain for ~ 5 yrs worse in past 4-5 months    Hand Dominance  Right    Next MD Visit  12/19    Prior Therapy  none       [facility] Adult PT Treatment/Exercise - 06/23/18 0001      Exercises   Exercises  Ankle;Neck;Shoulder      Neck Exercises: Standing   Other Standing Exercises  Rowing with yellow band x 10 reps bilat    Other Standing Exercises  L's x 10 reps, 3 sec hold;  W's x 10 with 3 sec hold - both with back against pool noodle.       Neck Exercises: Supine   Other Supine Exercise  prolonged snow angel then 10 active ones to  tolerance       Neck Exercises: Stretches   Upper Trapezius Stretch  Right;Left;2 reps;20 seconds      Ankle Exercises: Stretches   Gastroc Stretch  3 reps;30 seconds;Limitations      Ankle Exercises: Aerobic   Nustep  L4: arms/legs (60-70 SPM) x 8 min   PTA present to discuss progress     Ankle Exercises: Standing   SLS  Rt/Lt SLS with light occasional UE support to steady x 15-20 sec x 2 reps each side.     Other Standing Ankle Exercises  step down (4" step) with retro step up, bilat UE support x 10    Other Standing Ankle Exercises  ankle circles x 10 each direction, each leg.       Ankle Exercises: Seated   Other Seated Ankle Exercises  Rt/ Lt ankle eversion with red band x 10 reps; Rt/Lt DF with red band x 10 each foot.                   PT Long Term  Goals - 06/12/18 1324      PT LONG TERM GOAL #1   Title  Decrease cervical pain by 50-75% allowiing patient to perform normal functional activities with minimal to no pain and sleep without awakening due to pain 07/24/18    Time  6    Period  Weeks    Status  New      PT LONG TERM GOAL #2   Title  Increased cervical ROM by 3-5 degrees in bilat lateral flexion and rotation 07/24/18    Time  6    Period  Weeks    Status  New      PT LONG TERM GOAL #3   Title  Increase ankle pain to 5-/5 to 5/5 bilat 07/24/18    Time  6    Period  Weeks    Status  New      PT LONG TERM GOAL #4   Title  Independent in HEP 07/24/18    Time  6    Period  Weeks    Status  New      PT LONG TERM GOAL #5   Title  Improve FOTO to </= 28% limitation 07/24/18    Time  6    Period  Weeks    Status  New            Plan - 06/23/18 1348    Clinical Impression Statement  Pt reported increased neck discomfort with L's and W's; encouraged to dial back the contraction to ease discomfort.  Pt tolerated all other exercises well.  Pt reported reduction in neck discomfort after MHP and estim.  Making progress towards goals.     Rehab  Potential  Good    PT Frequency  2x / week    PT Duration  6 weeks    PT Treatment/Interventions  Patient/family education;ADLs/Self Care Home Management;Cryotherapy;Electrical Stimulation;Iontophoresis 4mg /ml Dexamethasone;Moist Heat;Ultrasound;Dry needling;Neuromuscular re-education;Therapeutic activities;Therapeutic exercise;Balance training;Manual techniques    PT Next Visit Plan  continue progressive stretching / strengthening of ankle and postural muscles; balance activities; manual work and modalities as indicated.     Consulted and Agree with Plan of Care  Patient       Patient will benefit from skilled therapeutic intervention in order to improve the following deficits and impairments:  Postural dysfunction, Improper body mechanics, Pain, Increased fascial restricitons, Increased muscle spasms, Hypomobility, Decreased mobility, Decreased range of motion, Decreased strength, Decreased activity tolerance  Visit Diagnosis: Cervicalgia  Muscle weakness (generalized)  Abnormal posture  Other symptoms and signs involving the musculoskeletal system     Problem List Patient Active Problem List   Diagnosis Date Noted  . IFG (impaired fasting glucose) 10/26/2017  . First degree ankle sprain, left, initial encounter 07/23/2016  . Family history of premature coronary heart disease 01/02/2013  . Rosacea 02/12/2011  . Hyperlipidemia 07/03/2010  . ELEVATED BLOOD PRESSURE WITHOUT DIAGNOSIS OF HYPERTENSION 07/03/2010  . GERD 06/02/2010  . CHEST PAIN, ATYPICAL 06/02/2010  . POSTMENOPAUSAL STATUS 06/02/2010   Kerin Perna, PTA 06/23/18 1:51 PM  [facility] [place], [place], [address] Phone: [phone]   Fax:  [phone]  Name: BELLAMIE TURNEY MRN: [id number] Date of Birth: [birth year]

## 2018-06-26 ENCOUNTER — Encounter: Payer: Self-pay | Admitting: Rehabilitative and Restorative Service Providers"

## 2018-06-26 ENCOUNTER — Ambulatory Visit (INDEPENDENT_AMBULATORY_CARE_PROVIDER_SITE_OTHER): Payer: Medicare Other | Admitting: Rehabilitative and Restorative Service Providers"

## 2018-06-26 DIAGNOSIS — M542 Cervicalgia: Secondary | ICD-10-CM | POA: Diagnosis not present

## 2018-06-26 DIAGNOSIS — R293 Abnormal posture: Secondary | ICD-10-CM | POA: Diagnosis not present

## 2018-06-26 DIAGNOSIS — M6281 Muscle weakness (generalized): Secondary | ICD-10-CM

## 2018-06-26 DIAGNOSIS — R29898 Other symptoms and signs involving the musculoskeletal system: Secondary | ICD-10-CM

## 2018-06-26 NOTE — Patient Instructions (Signed)
Resisted External Rotation: in Neutral - Bilateral   PALMS UP Sit or stand, tubing in both hands, elbows at sides, bent to 90, forearms forward. Pinch shoulder blades together and rotate forearms out. Keep elbows at sides. Repeat __10__ times per set. Do _2-3___ sets per session. Do _2-3___ sessions per day.   Low Row: Standing   Face anchor, feet shoulder width apart. Palms up, pull arms back, squeezing shoulder blades down and back. Repeat 10__ times per set. Do 2-3__ sets per session. Do 1-2__ sessions per day   Strengthening: Resisted Extension   Hold tubing in right hand, arm forward. Pull arm back, elbow straight. Repeat _10___ times per set. Do 2-3____ sets per session. Do 1-2__ sessions per day.

## 2018-06-26 NOTE — Therapy (Signed)
[place], [place], [address] Phone: [phone]   Fax:  [phone]  Physical Therapy Treatment  Patient Details  Name: Gina Norton MRN: [id number] Date of Birth: 1951-05-28 Referring Provider (PT): Dr Emeterio Reeve    Encounter Date: 06/26/2018  PT End of Session - 06/26/18 1442    Visit Number  4    Number of Visits  12    Date for PT Re-Evaluation  07/24/18    PT Start Time  1401    PT Stop Time  1452    PT Time Calculation (min)  51 min    Activity Tolerance  Patient tolerated treatment well       Past Medical History:  Diagnosis Date  . GERD (gastroesophageal reflux disease)     History reviewed. No pertinent surgical history.  There were no vitals filed for this visit.  Subjective Assessment - 06/26/18 1445    Subjective  Very tired today from cooking and cleaning from family visiting for the past few days. She can tell she is going up and down the stairs easier this weekend. Neck is stressed from activities of entertaning.     Currently in Pain?  Yes    Pain Score  5     Pain Location  Neck    Pain Orientation  Right;Left;Lower;Mid    Pain Descriptors / Indicators  Tightness;Dull;Aching    Pain Type  Chronic pain         [facility] PT Assessment - 06/26/18 0001      Assessment   Medical Diagnosis  Cervical dysfunction; bilat ankle weakness     Referring Provider (PT)  Dr Emeterio Reeve     Onset Date/Surgical Date  03/02/18   neck pain for ~ 5 yrs worse in past 4-5 months    Hand Dominance  Right    Next MD Visit  12/19    Prior Therapy  none       AROM   Cervical Flexion  42    Cervical Extension  50    Cervical - Right Side Bend  32    Cervical - Left Side Bend  31    Cervical - Right Rotation  55    Cervical - Left Rotation  62      Palpation   Palpation comment  muscular tightness bilat cervical and upper thoracic paraspinals; upper traps; leveators; pecs                     [facility] Adult PT Treatment/Exercise - 06/26/18 0001      Shoulder Exercises: Standing   Extension  Strengthening;Both;20 reps;Theraband    Theraband Level (Shoulder Extension)  Level 2 (Red)    Row  Strengthening;Both;20 reps;Theraband    Theraband Level (Shoulder Row)  Level 2 (Red)    Retraction  Strengthening;Both;15 reps;Theraband    Theraband Level (Shoulder Retraction)  Level 2 (Red)      Moist Heat Therapy   Number Minutes Moist Heat  20 Minutes    Moist Heat Location  Cervical   thoracic      Electrical Stimulation   Electrical Stimulation Location  bilat cervical and upper thoracic paraspinals     Electrical Stimulation Action  IFC    Electrical Stimulation Parameters  to tolerance    Electrical Stimulation Goals  Pain;Tone      Manual Therapy   Manual therapy comments  pt supine     Joint Mobilization  deep tissue work ant/lat/posterior cervical musculature; upper trap; upper thoracic paraspinals bilat     Soft tissue mobilization  cervical CPA mobs Grade II     Myofascial Release  anterior cervical to chest area bilat     Passive ROM  passive lateral cervical flexion to each side x 2 reps ~ 15-20 sec hold       Neck Exercises: Stretches   Upper Trapezius Stretch  Right;Left;2 reps;20 seconds    Neck Stretch  5 reps;10 seconds   axial extension supine      Ankle Exercises: Stretches   Gastroc Stretch  3 reps;30 seconds;Limitations      Ankle Exercises: Standing   SLS  Rt/Lt SLS with light occasional UE support to steady x 15-20 sec x 2 reps each side.     Toe Raise  10 reps    Other Standing Ankle Exercises  step down (4" step) with retro step up, bilat UE support x 10    Other Standing Ankle Exercises  ankle circles x 10 each direction, each leg.              PT Education - 06/26/18 1516    Education Details  HEP     Person(s) Educated  Patient    Methods  Explanation;Demonstration;Tactile cues;Handout    Comprehension   Verbalized understanding;Returned demonstration;Verbal cues required;Tactile cues required          PT Long Term Goals - 06/12/18 1324      PT LONG TERM GOAL #1   Title  Decrease cervical pain by 50-75% allowiing patient to perform normal functional activities with minimal to no pain and sleep without awakening due to pain 07/24/18    Time  6    Period  Weeks    Status  New      PT LONG TERM GOAL #2   Title  Increased cervical ROM by 3-5 degrees in bilat lateral flexion and rotation 07/24/18    Time  6    Period  Weeks    Status  New      PT LONG TERM GOAL #3   Title  Increase ankle pain to 5-/5 to 5/5 bilat 07/24/18    Time  6    Period  Weeks    Status  New      PT LONG TERM GOAL #4   Title  Independent in HEP 07/24/18    Time  6    Period  Weeks    Status  New      PT LONG TERM GOAL #5   Title  Improve FOTO to </= 28% limitation 07/24/18    Time  6    Period  Weeks    Status  New            Plan - 06/26/18 1442    Clinical Impression Statement  Patient fatigue today - due to family visit this past weekend and today. She has been very busy with entertaining. She continues to have "stress' through her neck and shoulders Patient continued with exercise and strengthening for LE's and posterior shoulder girdle musculature. Progressing gradually toward stated goals of treatment.     Rehab Potential  Good    PT Frequency  2x / week    PT Duration  6 weeks    PT Treatment/Interventions  Patient/family education;ADLs/Self Care Home Management;Cryotherapy;Electrical Stimulation;Iontophoresis 4mg /ml Dexamethasone;Moist Heat;Ultrasound;Dry needling;Neuromuscular re-education;Therapeutic activities;Therapeutic exercise;Balance training;Manual techniques    PT Next Visit Plan  continue progressive stretching / strengthening  of ankle and postural muscles; balance activities; manual work and modalities as indicated. Trial of pec stretch at next visit     Consulted and Agree  with Plan of Care  Patient       Patient will benefit from skilled therapeutic intervention in order to improve the following deficits and impairments:  Postural dysfunction, Improper body mechanics, Pain, Increased fascial restricitons, Increased muscle spasms, Hypomobility, Decreased mobility, Decreased range of motion, Decreased strength, Decreased activity tolerance  Visit Diagnosis: Cervicalgia  Muscle weakness (generalized)  Abnormal posture  Other symptoms and signs involving the musculoskeletal system     Problem List Patient Active Problem List   Diagnosis Date Noted  . IFG (impaired fasting glucose) 10/26/2017  . First degree ankle sprain, left, initial encounter 07/23/2016  . Family history of premature coronary heart disease 01/02/2013  . Rosacea 02/12/2011  . Hyperlipidemia 07/03/2010  . ELEVATED BLOOD PRESSURE WITHOUT DIAGNOSIS OF HYPERTENSION 07/03/2010  . GERD 06/02/2010  . CHEST PAIN, ATYPICAL 06/02/2010  . POSTMENOPAUSAL STATUS 06/02/2010    Gina Norton Nilda Simmer PT, MPH  06/26/2018, 3:17 PM  [facility] [place], [place], [address] Phone: [phone]   Fax:  [phone]  Name: Gina Norton MRN: [id number] Date of Birth: 08-29-1950

## 2018-07-03 ENCOUNTER — Ambulatory Visit: Payer: Medicare Other

## 2018-07-03 NOTE — Progress Notes (Signed)
Subjective:   Gina Norton is a 67 y.o. female who presents for an Initial Medicare Annual Wellness Visit.  Review of Systems    No ROS.  Medicare Wellness Visit. Additional risk factors are reflected in the social history.    Cardiac Risk Factors include: hypertension;dyslipidemia;advanced age (>36men, >88 women) Sleep patterns: {SX; SLEEP PATTERNS:Getting 8 hourss of sleep a night. Upon wakening feels rested. Does not wake up during the night to use the bathroom.   Home Safety/Smoke Alarms: Feels safe in home. Smoke alarms in place.  Living environment; Lives with husband in 2 story home. Stairs have hand rails on the steps. Shower is a walk in but has no grab bars in place. Seat Belt Safety/Bike Helmet: Wears seat belt.   Female:   Pap- aged out      [place]- declines      Dexa scan- ordered       CCS- declines      Objective:    Today's Vitals   07/04/18 1356  BP: 121/62  Pulse: 74  SpO2: 95%  Weight: 172 lb (78 kg)  Height: 5\' 2"  (1.575 m)   Body mass index is 31.46 kg/m.  Advanced Directives 07/04/2018 06/12/2018  Does Patient Have a Medical Advance Directive? No No  Would patient like information on creating a medical advance directive? Yes (MAU/Ambulatory/Procedural Areas - Information given) No - Patient declined    Current Medications (verified) Outpatient Encounter Medications as of 07/04/2018  Medication Sig  . AMBULATORY NON FORMULARY MEDICATION Medication Name: chia seeds  . AMBULATORY NON FORMULARY MEDICATION Medication Name: flax seeds  . Ascorbic Acid (VITAMIN C) 1000 MG tablet Take 1,000 mg by mouth daily.  . B Complex Vitamins (VITAMIN B COMPLEX PO) Take by mouth.  . Cholecalciferol (VITAMIN D3) 1000 units CAPS Take 4,000 Int'l Units by mouth.  . CHOLINE PO Take 550 mg by mouth.  . Coenzyme Q10 (CO Q 10) 100 MG CAPS Take by mouth.  . FOLIC ACID PO Take by mouth.  . Lutein 20 MG CAPS Take by mouth 1 day or 1 dose.  . magnesium 30 MG tablet Take 30  mg by mouth 2 (two) times daily.  . Multiple Vitamins-Minerals (ZINC PO) Take by mouth.  . SPIRULINA PO Take 400 mg by mouth.  Javier Docker Oil 500 MG CAPS Take 500 mg by mouth.  . vitamin E 400 UNIT capsule Take 400 Units by mouth daily.  . [DISCONTINUED] doxycycline (VIBRAMYCIN) 100 MG capsule Take 100 mg by mouth daily.   No facility-administered encounter medications on file as of 07/04/2018.     Allergies (verified) Other   History: Past Medical History:  Diagnosis Date  . GERD (gastroesophageal reflux disease)    History reviewed. No pertinent surgical history. Family History  Problem Relation Age of Onset  . Lung cancer Father 52       smoker  . Heart disease Mother        family history  . Diabetes Maternal Grandmother   . Hypertension Maternal Grandfather   . Lung cancer Paternal Grandfather   . Thyroid cancer Son   . Leukemia Grandchild    Social History   Socioeconomic History  . Marital status: Married    Spouse name: Jesus   . Number of children: 10  . Years of education: 10  . Highest education level: 12th grade  Occupational History  . Occupation: Scientist, research (physical sciences): UNEMPLOYED  Social Needs  . Financial resource strain:  Not hard at all  . Food insecurity:    Worry: Never true    Inability: Never true  . Transportation needs:    Medical: No    Non-medical: No  Tobacco Use  . Smoking status: Never Smoker  . Smokeless tobacco: Never Used  Substance and Sexual Activity  . Alcohol use: No  . Drug use: No  . Sexual activity: Yes    Partners: Male  Lifestyle  . Physical activity:    Days per week: 3 days    Minutes per session: 30 min  . Stress: Not at all  Relationships  . Social connections:    Talks on phone: Once a week    Gets together: Once a week    Attends religious service: More than 4 times per year    Active member of club or organization: No    Attends meetings of clubs or organizations: Never    Relationship status: Married   Other Topics Concern  . Not on file  Social History Narrative   . Walks for exercise. Rides stationary bike at home for exercise. Drinks green tea during the day    Tobacco Counseling Counseling given: Not Answered   Clinical Intake:  Pre-visit preparation completed: Yes  Pain : No/denies pain     Nutritional Risks: None Diabetes: No  How often do you need to have someone help you when you read instructions, pamphlets, or other written materials from your doctor or pharmacy?: 1 - Never What is the last grade level you completed in school?: 12  Interpreter Needed?: No  Information entered by :: Orlie Dakin, LPN   Activities of Daily Living In your present state of health, do you have any difficulty performing the following activities: 07/04/2018  Hearing? N  Vision? N  Difficulty concentrating or making decisions? N  Walking or climbing stairs? N  Dressing or bathing? N  Doing errands, shopping? N  Preparing Food and eating ? N  Using the Toilet? N  In the past six months, have you accidently leaked urine? N  Do you have problems with loss of bowel control? N  Managing your Medications? N  Managing your Finances? N  Housekeeping or managing your Housekeeping? N  Some recent data might be hidden     Immunizations and Health Maintenance  There is no immunization history on file for this patient. Health Maintenance Due  Topic Date Due  . Hepatitis C Screening  [birth year]    Patient Care Team: Hali Marry, MD as PCP - General (Family Medicine)  Indicate any recent Medical Services you may have received from other than [facility] providers in the past year (date may be approximate).     Assessment:   This is a routine wellness examination for [facility].Physical assessment deferred to PCP.   Hearing/Vision screen  Visual Acuity Screening   Right eye Left eye Both eyes  Without correction: 20/20 20/20 20/20   With correction:     Hearing Screening  Comments: Whisper test done patient reported back 2 out of the 3 words given.  Dietary issues and exercise activities discussed: Current Exercise Habits: Home exercise routine, Type of exercise: walking(stationary bike), Time (Minutes): 30, Frequency (Times/Week): 3, Weekly Exercise (Minutes/Week): 90, Intensity: Mild Diet  Eats healthy except during the holidays. :       Goals    . Exercise 3x per week (30 min per time)     Exercise at least 3 times a day for 30 minute at a  time. Plans on joining silver sneakers at the first of the year.      Depression Screen PHQ 2/9 Scores 07/04/2018 06/01/2018 10/25/2017  PHQ - 2 Score 0 0 0  PHQ- 9 Score 0 1 -    Fall Risk Fall Risk  07/04/2018 10/25/2017  Falls in the past year? 0 No  Follow up Falls prevention discussed -    Is the patient's home free of loose throw rugs in walkways, pet beds, electrical cords, etc?   yes      Grab bars in the bathroom? no      Handrails on the stairs?   yes      Adequate lighting?   no  Cognitive Function:     6CIT Screen 07/04/2018  What Year? 0 points  What month? 0 points  What time? 0 points  Count back from 20 0 points  Months in reverse 0 points  Repeat phrase 0 points  Total Score 0    Screening Tests Health Maintenance  Topic Date Due  . Hepatitis C Screening  1951-07-09  . COLONOSCOPY  10/26/2018 (Originally 04/03/2001)  . INFLUENZA VACCINE  01/01/2019 (Originally 03/02/2018)  . MAMMOGRAM  07/05/2019 (Originally 06/10/2012)  . PNA vac Low Risk Adult (1 of 2 - PCV13) 10/26/2019 (Originally 04/03/2016)  . TETANUS/TDAP  02/11/2021  . DEXA SCAN  Completed       Plan:    Ms. Zimmermann , Thank you for taking time to come for your Medicare Wellness Visit. I appreciate your ongoing commitment to your health goals. Please review the following plan we discussed and let me know if I can assist you in the future.  Please schedule your next medicare wellness visit with me in 1 yr. Bring a copy of  your living will and/or healthcare power of attorney to your next office visit.   These are the goals we discussed: Goals    . Exercise 3x per week (30 min per time)     Exercise at least 3 times a day for 30 minute at a time. Plans on joining silver sneakers at the first of the year.       This is a list of the screening recommended for you and due dates:  Health Maintenance  Topic Date Due  .  Hepatitis C: One time screening is recommended by Center for Disease Control  (CDC) for  adults born from [birth year] through 1965.   07-07-1951  . Colon Cancer Screening  10/26/2018*  . Flu Shot  01/01/2019*  . Mammogram  07/05/2019*  . Pneumonia vaccines (1 of 2 - PCV13) 10/26/2019*  . Tetanus Vaccine  02/11/2021  . DEXA scan (bone density measurement)  Completed  *Topic was postponed. The date shown is not the original due date.     I have personally reviewed and noted the following in the patient's chart:   . Medical and social history . Use of alcohol, tobacco or illicit drugs  . Current medications and supplements . Functional ability and status . Nutritional status . Physical activity . Advanced directives . List of other physicians . Hospitalizations, surgeries, and ER visits in previous 12 months . Vitals . Screenings to include cognitive, depression, and falls . Referrals and appointments  In addition, I have reviewed and discussed with patient certain preventive protocols, quality metrics, and best practice recommendations. A written personalized care plan for preventive services as well as general preventive health recommendations were provided to patient.     Janalyn Harder  Araceli Bouche, LPN   16/0/1093

## 2018-07-04 ENCOUNTER — Ambulatory Visit (INDEPENDENT_AMBULATORY_CARE_PROVIDER_SITE_OTHER): Payer: Medicare Other | Admitting: *Deleted

## 2018-07-04 VITALS — BP 121/62 | HR 74 | Ht 62.0 in | Wt 172.0 lb

## 2018-07-04 DIAGNOSIS — Z1159 Encounter for screening for other viral diseases: Secondary | ICD-10-CM

## 2018-07-04 DIAGNOSIS — Z13820 Encounter for screening for osteoporosis: Secondary | ICD-10-CM | POA: Diagnosis not present

## 2018-07-04 DIAGNOSIS — Z0000 Encounter for general adult medical examination without abnormal findings: Secondary | ICD-10-CM | POA: Diagnosis not present

## 2018-07-04 DIAGNOSIS — Z780 Asymptomatic menopausal state: Secondary | ICD-10-CM | POA: Diagnosis not present

## 2018-07-04 NOTE — Patient Instructions (Signed)
Ms. Bocchino , Thank you for taking time to come for your Medicare Wellness Visit. I appreciate your ongoing commitment to your health goals. Please review the following plan we discussed and let me know if I can assist you in the future.  Please schedule your next medicare wellness visit with me in 1 yr. Bring a copy of your living will and/or healthcare power of attorney to your next office visit.  These are the goals we discussed: Goals    . Exercise 3x per week (30 min per time)     Exercise at least 3 times a day for 30 minute at a time. Plans on joining silver sneakers at the first of the year.

## 2018-07-05 ENCOUNTER — Ambulatory Visit (INDEPENDENT_AMBULATORY_CARE_PROVIDER_SITE_OTHER): Payer: Medicare Other | Admitting: Physical Therapy

## 2018-07-05 ENCOUNTER — Encounter: Payer: Self-pay | Admitting: Physical Therapy

## 2018-07-05 ENCOUNTER — Ambulatory Visit (INDEPENDENT_AMBULATORY_CARE_PROVIDER_SITE_OTHER): Payer: Medicare Other

## 2018-07-05 DIAGNOSIS — M810 Age-related osteoporosis without current pathological fracture: Secondary | ICD-10-CM | POA: Diagnosis not present

## 2018-07-05 DIAGNOSIS — R293 Abnormal posture: Secondary | ICD-10-CM | POA: Diagnosis not present

## 2018-07-05 DIAGNOSIS — M6281 Muscle weakness (generalized): Secondary | ICD-10-CM | POA: Diagnosis not present

## 2018-07-05 DIAGNOSIS — Z780 Asymptomatic menopausal state: Secondary | ICD-10-CM

## 2018-07-05 DIAGNOSIS — M542 Cervicalgia: Secondary | ICD-10-CM

## 2018-07-05 DIAGNOSIS — Z13820 Encounter for screening for osteoporosis: Secondary | ICD-10-CM

## 2018-07-05 DIAGNOSIS — M8589 Other specified disorders of bone density and structure, multiple sites: Secondary | ICD-10-CM | POA: Diagnosis not present

## 2018-07-05 LAB — HEPATITIS C ANTIBODY
Hepatitis C Ab: NONREACTIVE
SIGNAL TO CUT-OFF: 0.02 (ref <1.00)

## 2018-07-05 NOTE — Therapy (Signed)
[place], [place], [address] Phone: [phone]   Fax:  [phone]  Physical Therapy Treatment  Patient Details  Name: Gina Norton MRN: [id number] Date of Birth: [birth year] Referring Provider (PT): Dr Emeterio Reeve    Encounter Date: 07/05/2018  PT End of Session - 07/05/18 1439    Visit Number  5    Number of Visits  12    Date for PT Re-Evaluation  07/24/18    PT Start Time  9211    PT Stop Time  1530    PT Time Calculation (min)  57 min    Activity Tolerance  Patient tolerated treatment well;No increased pain    Behavior During Therapy  WFL for tasks assessed/performed       Past Medical History:  Diagnosis Date  . GERD (gastroesophageal reflux disease)     History reviewed. No pertinent surgical history.  There were no vitals filed for this visit.  Subjective Assessment - 07/05/18 1441    Subjective  Pt reports she is having less episodes of pain in her neck lately.  She notices that her Rt ankle still feels a little weak when descending steps.  She has gone back to taking one step at a time (step-to pattern).   She thinks her balance has improved.     Patient Stated Goals  decrease neck pain and have more strength in her ankles     Currently in Pain?  No/denies    Pain Score  0-No pain         [facility] PT Assessment - 07/05/18 0001      Assessment   Medical Diagnosis  Cervical dysfunction; bilat ankle weakness     Referring Provider (PT)  Dr Emeterio Reeve     Onset Date/Surgical Date  03/02/18   neck pain for ~ 5 yrs worse in past 4-5 months    Hand Dominance  Right    Next MD Visit  12/19    Prior Therapy  none       AROM   Cervical Flexion  50    Cervical Extension  42    Cervical - Right Side Bend  34    Cervical - Left Side Bend  22    Cervical - Right Rotation  55    Cervical - Left Rotation  68       [facility] Adult PT Treatment/Exercise - 07/05/18 0001       Exercises   Exercises  Knee/Hip;Neck      Neck Exercises: Standing   Neck Retraction  10 reps   cues and demo for improved form.    Other Standing Exercises  W's x 5 reps       Knee/Hip Exercises: Standing   Step Down  Right;Left;1 set;10 reps;Hand Hold: 2;Step Height: 4"    Step Down Limitations  6" step too difficult.       Shoulder Exercises: Standing   Extension  Strengthening;Both;10 reps;Theraband    Theraband Level (Shoulder Extension)  Level 1 (Yellow)    Row  Strengthening;Both;10 reps;Theraband    Theraband Level (Shoulder Row)  Level 2 (Red)    Retraction  Strengthening;Both;5 reps    Theraband Level (Shoulder Retraction)  Level 2 (Red);Level 1 (Yellow)   3 sets of 5; after 1st, moved to yellow band     Moist Heat Therapy   Number Minutes Moist Heat  15 Minutes    Moist Heat Location  Cervical  thoracic      Electrical Stimulation   Electrical Stimulation Location  bilat cervical and upper thoracic paraspinals     Electrical Stimulation Action  IFC    Electrical Stimulation Parameters  to tolerance    Electrical Stimulation Goals  Pain      Ankle Exercises: Standing   SLS  Rt/Lt SLS with to steady x 15-25 sec (wihtout support) x 2 reps each side.     Heel Raises  Both;10 reps   with toe raises; UE support on counter   Heel Walk (Round Trip)  3 ft Rt/Lt x 2 sets with UE support.       Ankle Exercises: Stretches   Gastroc Stretch  3 reps;30 seconds   incline board      Ankle Exercises: Aerobic   Nustep  L4: arms/legs (60-70 SPM) x 8 min   PTA present to discuss progress     Neck Exercises: Stretches   Upper Trapezius Stretch  Right;Left;2 reps;20 seconds    Other Neck Stretches  low and mid level doorway stretch x 15 sec x 2 reps each ( unable to tolerate low position);  trial of pec stretch unilateral with elbow straight x 15 sec each side.                   PT Long Term Goals - 07/05/18 1502      PT LONG TERM GOAL #1   Title  Decrease  cervical pain by 50-75% allowiing patient to perform normal functional activities with minimal to no pain and sleep without awakening due to pain 07/24/18    Baseline  50% reduction - 07/05/18    Time  6    Period  Weeks    Status  On-going      PT LONG TERM GOAL #2   Title  Increased cervical ROM by 3-5 degrees in bilat lateral flexion and rotation 07/24/18    Time  6    Period  Weeks    Status  On-going      PT LONG TERM GOAL #3   Title  Increase ankle pain to 5-/5 to 5/5 bilat 07/24/18    Time  6    Period  Weeks    Status  On-going      PT LONG TERM GOAL #4   Title  Independent in HEP 07/24/18    Time  6    Period  Weeks    Status  On-going      PT LONG TERM GOAL #5   Title  Improve FOTO to </= 28% limitation 07/24/18    Time  6    Period  Weeks    Status  On-going            Plan - 07/05/18 1543    Clinical Impression Statement  Pt reporting 50% reduction in neck pain; making good gains towards LTG#1.  She reports mixed compliance with HEP exercises.  She reported preference for UE exercise with yellow band instead of red, as red was increasing neck discomfort.   Neck discomfort resolved with use of estim/MHP at end of session.  Progressing towards goals.    Rehab Potential  Good    PT Frequency  2x / week    PT Duration  6 weeks    PT Treatment/Interventions  Patient/family education;ADLs/Self Care Home Management;Cryotherapy;Electrical Stimulation;Iontophoresis 4mg /ml Dexamethasone;Moist Heat;Ultrasound;Dry needling;Neuromuscular re-education;Therapeutic activities;Therapeutic exercise;Balance training;Manual techniques    PT Next Visit Plan  add HEP exercises for postural strengthening  for neck/thoracic that don't require band.        Patient will benefit from skilled therapeutic intervention in order to improve the following deficits and impairments:  Postural dysfunction, Improper body mechanics, Pain, Increased fascial restricitons, Increased muscle spasms,  Hypomobility, Decreased mobility, Decreased range of motion, Decreased strength, Decreased activity tolerance  Visit Diagnosis: Cervicalgia  Muscle weakness (generalized)  Abnormal posture     Problem List Patient Active Problem List   Diagnosis Date Noted  . IFG (impaired fasting glucose) 10/26/2017  . First degree ankle sprain, left, initial encounter 07/23/2016  . Family history of premature coronary heart disease 01/02/2013  . Rosacea 02/12/2011  . Hyperlipidemia 07/03/2010  . ELEVATED BLOOD PRESSURE WITHOUT DIAGNOSIS OF HYPERTENSION 07/03/2010  . GERD 06/02/2010  . CHEST PAIN, ATYPICAL 06/02/2010  . POSTMENOPAUSAL STATUS 06/02/2010   Kerin Perna, PTA 07/05/18 3:53 PM  [place], [place], [address] Phone: [phone]   Fax:  [phone]  Name: Gina Norton MRN: [id number] Date of Birth: Nov 17, 1950

## 2018-07-07 ENCOUNTER — Ambulatory Visit (INDEPENDENT_AMBULATORY_CARE_PROVIDER_SITE_OTHER): Payer: Medicare Other | Admitting: Rehabilitative and Restorative Service Providers"

## 2018-07-07 ENCOUNTER — Encounter: Payer: Self-pay | Admitting: Rehabilitative and Restorative Service Providers"

## 2018-07-07 DIAGNOSIS — M6281 Muscle weakness (generalized): Secondary | ICD-10-CM | POA: Diagnosis not present

## 2018-07-07 DIAGNOSIS — M542 Cervicalgia: Secondary | ICD-10-CM | POA: Diagnosis not present

## 2018-07-07 DIAGNOSIS — R293 Abnormal posture: Secondary | ICD-10-CM

## 2018-07-07 DIAGNOSIS — R29898 Other symptoms and signs involving the musculoskeletal system: Secondary | ICD-10-CM | POA: Diagnosis not present

## 2018-07-07 NOTE — Patient Instructions (Signed)
Wall Shoulder Press-Out - -  on forearms not hands     With forearms resting on wall, shoulder-width apart, press shoulders back. Return. Repeat _5-10___ times Do __1-2__ sessions per day.

## 2018-07-07 NOTE — Therapy (Signed)
[place], [place], [address] Phone: [phone]   Fax:  [phone]  Physical Therapy Treatment  Patient Details  Name: Gina Norton MRN: [id number] Date of Birth: 26-May-1951 Referring Provider (PT): Dr Emeterio Reeve    Encounter Date: 07/07/2018  PT End of Session - 07/07/18 1410    Visit Number  6    Number of Visits  12    Date for PT Re-Evaluation  07/24/18    PT Start Time  1404    PT Stop Time  1445    PT Time Calculation (min)  41 min    Activity Tolerance  Patient tolerated treatment well       Past Medical History:  Diagnosis Date  . GERD (gastroesophageal reflux disease)     History reviewed. No pertinent surgical history.  There were no vitals filed for this visit.  Subjective Assessment - 07/07/18 1414    Subjective  Patient reports that she is having less pain in the neck - sometimes does not notice the pain at all. Found out she has osteoporosis and would like exercises to address this. Does not like doing exercises that require weight bearing through the wrists.     Currently in Pain?  Yes    Pain Score  1     Pain Location  Neck    Pain Orientation  Right;Left;Mid;Lower    Pain Descriptors / Indicators  Aching;Dull;Tightness                       [facility] Adult PT Treatment/Exercise - 07/07/18 0001      Exercises   Exercises  Knee/Hip      Neck Exercises: Standing   Other Standing Exercises  wall planks 20 sec x 3; scap protraction/retraction x 8 resting on forearms for boh exercises       Knee/Hip Exercises: Standing   Hip Abduction  Stengthening;Right;Left;2 sets;10 reps;Knee straight   lerading with heel    Step Down  Right;Left;1 set;15 reps;Hand Hold: 2;Step Height: 4"      Moist Heat Therapy   Number Minutes Moist Heat  15 Minutes    Moist Heat Location  Cervical   thoracic      Electrical Stimulation   Electrical Stimulation Location   bilat cervical and upper thoracic paraspinals     Electrical Stimulation Action  IFC    Electrical Stimulation Parameters  to tolerance    Electrical Stimulation Goals  Pain;Tone      Ankle Exercises: Aerobic   Nustep  L5: arms/legs (UE's 10) x 6 min      Ankle Exercises: Standing   SLS  SLS rt/Lt ~ 20 sec hold minimal UE support as needed for balance     Toe Raise  10 reps      Ankle Exercises: Stretches   Gastroc Stretch  3 reps;30 seconds   incline board      Neck Exercises: Stretches   Other Neck Stretches  doorway stretch 30 sec x 2 reps mid and lower positions              PT Education - 07/07/18 1436    Education Details  HEP     Person(s) Educated  Patient    Methods  Explanation;Demonstration;Tactile cues;Verbal cues;Handout    Comprehension  Verbalized understanding;Returned demonstration;Verbal cues required;Tactile cues required          PT Long Term Goals - 07/05/18 1502  PT LONG TERM GOAL #1   Title  Decrease cervical pain by 50-75% allowiing patient to perform normal functional activities with minimal to no pain and sleep without awakening due to pain 07/24/18    Baseline  50% reduction - 07/05/18    Time  6    Period  Weeks    Status  On-going      PT LONG TERM GOAL #2   Title  Increased cervical ROM by 3-5 degrees in bilat lateral flexion and rotation 07/24/18    Time  6    Period  Weeks    Status  On-going      PT LONG TERM GOAL #3   Title  Increase ankle pain to 5-/5 to 5/5 bilat 07/24/18    Time  6    Period  Weeks    Status  On-going      PT LONG TERM GOAL #4   Title  Independent in HEP 07/24/18    Time  6    Period  Weeks    Status  On-going      PT LONG TERM GOAL #5   Title  Improve FOTO to </= 28% limitation 07/24/18    Time  6    Period  Weeks    Status  On-going            Plan - 07/07/18 1410    Clinical Impression Statement  Improving. Patient reports that she has some pain in the neck but it is not bad and  she sometimes does not notice neck pain at all. She continues to require cues for exercise techniques and was encouraged to be consistent wth HEP. Progressing gradually toward rehab goals.     Rehab Potential  Good    PT Frequency  2x / week    PT Duration  6 weeks    PT Treatment/Interventions  Patient/family education;ADLs/Self Care Home Management;Cryotherapy;Electrical Stimulation;Iontophoresis 4mg /ml Dexamethasone;Moist Heat;Ultrasound;Dry needling;Neuromuscular re-education;Therapeutic activities;Therapeutic exercise;Balance training;Manual techniques    PT Next Visit Plan  add HEP exercises for postural strengthening for neck/thoracic that don't require band.        Patient will benefit from skilled therapeutic intervention in order to improve the following deficits and impairments:  Postural dysfunction, Improper body mechanics, Pain, Increased fascial restricitons, Increased muscle spasms, Hypomobility, Decreased mobility, Decreased range of motion, Decreased strength, Decreased activity tolerance  Visit Diagnosis: Cervicalgia  Muscle weakness (generalized)  Abnormal posture  Other symptoms and signs involving the musculoskeletal system     Problem List Patient Active Problem List   Diagnosis Date Noted  . IFG (impaired fasting glucose) 10/26/2017  . First degree ankle sprain, left, initial encounter 07/23/2016  . Family history of premature coronary heart disease 01/02/2013  . Rosacea 02/12/2011  . Hyperlipidemia 07/03/2010  . ELEVATED BLOOD PRESSURE WITHOUT DIAGNOSIS OF HYPERTENSION 07/03/2010  . GERD 06/02/2010  . CHEST PAIN, ATYPICAL 06/02/2010  . POSTMENOPAUSAL STATUS 06/02/2010    Jatin Naumann Nilda Simmer  PT, MPH  07/07/2018, 2:36 PM  [facility] [place], [place], [address] Phone: [phone]   Fax:  [phone]  Name: Gina Norton MRN: [id number] Date of Birth: 02/19/1951

## 2018-07-10 ENCOUNTER — Ambulatory Visit (INDEPENDENT_AMBULATORY_CARE_PROVIDER_SITE_OTHER): Payer: Medicare Other | Admitting: Rehabilitative and Restorative Service Providers"

## 2018-07-10 ENCOUNTER — Encounter: Payer: Self-pay | Admitting: Rehabilitative and Restorative Service Providers"

## 2018-07-10 DIAGNOSIS — R293 Abnormal posture: Secondary | ICD-10-CM | POA: Diagnosis not present

## 2018-07-10 DIAGNOSIS — M542 Cervicalgia: Secondary | ICD-10-CM

## 2018-07-10 DIAGNOSIS — M6281 Muscle weakness (generalized): Secondary | ICD-10-CM

## 2018-07-10 DIAGNOSIS — R29898 Other symptoms and signs involving the musculoskeletal system: Secondary | ICD-10-CM | POA: Diagnosis not present

## 2018-07-10 NOTE — Patient Instructions (Signed)
Ball on wall - rolling ball on wall with ball on the back of hand/forearm  Vary angles of shoulder and elbow  Standing with side to wall the gradually turn back to wall more  1-2 min several reps    SIT TO STAND: No Device    Sit with feet shoulder-width apart, on floor. Lean chest forward, raise hips up from surface. Straighten hips and knees. Weight bear equally on left and right sides. __10_ reps per set, _1-3__ sets per day, _2-3 times/day   Knee [facility] a chair for balance, slowly bend knees. Keep both feet on the floor. Repeat __10__ times. Do __2-3__ sessions per day.

## 2018-07-10 NOTE — Therapy (Addendum)
[place]  [place], [place], [address] Phone: [phone]   Fax:  [phone]  Physical Therapy Treatment  Patient Details  Name: Gina Norton MRN: [id number] Date of Birth: [birth year] Referring Provider (PT): Dr Emeterio Reeve    Encounter Date: 07/10/2018  PT End of Session - 07/10/18 1406    Visit Number  7    Number of Visits  12    Date for PT Re-Evaluation  07/24/18    PT Start Time  1402    PT Stop Time  1447    PT Time Calculation (min)  45 min    Activity Tolerance  Patient tolerated treatment well       Past Medical History:  Diagnosis Date  . GERD (gastroesophageal reflux disease)     History reviewed. No pertinent surgical history.  There were no vitals filed for this visit.  Subjective Assessment - 07/10/18 1407    Subjective  Patient reports that she did exercises yesterday and experienced increased pain following exercises. Discussed that she can use heat at home after exercises.     Currently in Pain?  No/denies    Pain Score  0-No pain                       [facility] Adult PT Treatment/Exercise - 07/10/18 0001      Shoulder Exercises: Therapy Ball   Other Therapy Ball Exercises  ball at dorsal wrist/forearm working on rolling ball on wall with elbow at 90 deg varying heights for shd flexion - working into horizontal abduction 20-30 reps each     Other Therapy Ball Exercises  rolling ball up wall working on shoulder flexion head height and slightly higher ~ 1-2 min       Moist Heat Therapy   Number Minutes Moist Heat  20 Minutes    Moist Heat Location  Cervical   thoracic      Electrical Stimulation   Electrical Stimulation Location  bilat cervical and upper thoracic paraspinals     Electrical Stimulation Action  IFC    Electrical Stimulation Parameters  to tolerance    Electrical Stimulation Goals  Pain;Tone      Ankle Exercises: Aerobic   Nustep  L5:  arms/legs (UE's 10) x 5 min      Neck Exercises: Stretches   Other Neck Stretches  doorway stretch 30 sec x 3 reps mid and lower positions       Ankle Exercises: Seated   Other Seated Ankle Exercises  sit to stand x 10 min UE use; shallow knee bend x 10 reps              PT Education - 07/10/18 1433    Education Details  HEP     Person(s) Educated  Patient    Methods  Explanation;Demonstration;Tactile cues;Verbal cues;Handout    Comprehension  Verbalized understanding;Returned demonstration;Verbal cues required;Tactile cues required          PT Long Term Goals - 07/05/18 1502      PT LONG TERM GOAL #1   Title  Decrease cervical pain by 50-75% allowiing patient to perform normal functional activities with minimal to no pain and sleep without awakening due to pain 07/24/18    Baseline  50% reduction - 07/05/18    Time  6    Period  Weeks    Status  On-going      PT LONG TERM GOAL #2  Title  Increased cervical ROM by 3-5 degrees in bilat lateral flexion and rotation 07/24/18    Time  6    Period  Weeks    Status  On-going      PT LONG TERM GOAL #3   Title  Increase ankle pain to 5-/5 to 5/5 bilat 07/24/18    Time  6    Period  Weeks    Status  On-going      PT LONG TERM GOAL #4   Title  Independent in HEP 07/24/18    Time  6    Period  Weeks    Status  On-going      PT LONG TERM GOAL #5   Title  Improve FOTO to </= 28% limitation 07/24/18    Time  6    Period  Weeks    Status  On-going            Plan - 07/10/18 1407    Clinical Impression Statement  Gina Norton continues to experience intermittent pain and discomfort in the cervical musculature with activities. Will benefit from continued work to improve posture and alignment and increase postural strength. Needs to be consistent with HEP.     Rehab Potential  Good    PT Frequency  2x / week    PT Duration  6 weeks    PT Treatment/Interventions  Patient/family education;ADLs/Self Care Home  Management;Cryotherapy;Electrical Stimulation;Iontophoresis 36m/ml Dexamethasone;Moist Heat;Ultrasound;Dry needling;Neuromuscular re-education;Therapeutic activities;Therapeutic exercise;Balance training;Manual techniques    PT Next Visit Plan  add HEP exercises for postural strengthening for neck/thoracic that don't require band.        Patient will benefit from skilled therapeutic intervention in order to improve the following deficits and impairments:  Postural dysfunction, Improper body mechanics, Pain, Increased fascial restricitons, Increased muscle spasms, Hypomobility, Decreased mobility, Decreased range of motion, Decreased strength, Decreased activity tolerance  Visit Diagnosis: Cervicalgia  Muscle weakness (generalized)  Abnormal posture  Other symptoms and signs involving the musculoskeletal system     Problem List Patient Active Problem List   Diagnosis Date Noted  . IFG (impaired fasting glucose) 10/26/2017  . First degree ankle sprain, left, initial encounter 07/23/2016  . Family history of premature coronary heart disease 01/02/2013  . Rosacea 02/12/2011  . Hyperlipidemia 07/03/2010  . ELEVATED BLOOD PRESSURE WITHOUT DIAGNOSIS OF HYPERTENSION 07/03/2010  . GERD 06/02/2010  . CHEST PAIN, ATYPICAL 06/02/2010  . POSTMENOPAUSAL STATUS 06/02/2010    Gina Norton PNilda SimmerPT, MPH  07/10/2018, 2:34 PM  [facility] [address] [address]Phone: [phone]  Fax:  [phone] Name: Gina DURANGOMRN: [id number] of Birth: [birth year] PHYSICAL THERAPY DISCHARGE SUMMARY  Visits from Start of Care: 7  Current functional level related to goals / functional outcomes: See progress note for discharge status    Remaining deficits: Doing well - shoulder continue with independent HEP and postural correction    Education / Equipment: HEP  Plan: Patient agrees to discharge.  Patient goals were  partially met. Patient is being discharged due to being pleased with the current functional level.  ?????    Gina Norton P. HHelene KelpPT, MPH 07/13/18 3:29 PM

## 2018-07-13 ENCOUNTER — Encounter: Payer: Medicare Other | Admitting: Physical Therapy

## 2018-09-04 ENCOUNTER — Encounter: Payer: Self-pay | Admitting: Family Medicine

## 2018-09-07 ENCOUNTER — Encounter: Payer: Self-pay | Admitting: Family Medicine

## 2018-10-09 DIAGNOSIS — H524 Presbyopia: Secondary | ICD-10-CM | POA: Diagnosis not present

## 2018-12-18 ENCOUNTER — Encounter: Payer: Self-pay | Admitting: Family Medicine

## 2018-12-18 ENCOUNTER — Ambulatory Visit (INDEPENDENT_AMBULATORY_CARE_PROVIDER_SITE_OTHER): Payer: PRIVATE HEALTH INSURANCE | Admitting: Family Medicine

## 2018-12-18 VITALS — BP 122/66 | HR 74 | Ht 62.0 in | Wt 180.0 lb

## 2018-12-18 DIAGNOSIS — H60543 Acute eczematoid otitis externa, bilateral: Secondary | ICD-10-CM | POA: Diagnosis not present

## 2018-12-18 DIAGNOSIS — S01302A Unspecified open wound of left ear, initial encounter: Secondary | ICD-10-CM

## 2018-12-18 MED ORDER — DOXYCYCLINE HYCLATE 100 MG PO TABS: 100.0000 mg | ORAL_TABLET | Freq: Two times a day (BID) | ORAL | 0 refills | Status: DC

## 2018-12-18 MED ORDER — FLUOCINOLONE ACETONIDE 0.01 % OT OIL: 1.0000 "application " | TOPICAL_OIL | Freq: Every evening | OTIC | 0 refills | Status: DC | PRN

## 2018-12-18 MED ORDER — MUPIROCIN 2 % EX OINT: TOPICAL_OINTMENT | Freq: Two times a day (BID) | CUTANEOUS | 0 refills | Status: AC

## 2018-12-18 NOTE — Progress Notes (Signed)
Acute Office Visit  Subjective:    Patient ID: Gina Norton, female    DOB: 05-22-1951, 68 y.o.   MRN: [id number]  Chief Complaint  Patient presents with  . Rash    behind L ear x 2 mos she has been using alcohol and pepermint oil denies any changes in her lotions, soaps, shampoo, ect, that area is itchy and sore     HPI Patient is in today for rash behind her left ear.  She says is been there for about 2 months.  She keeps using alcohol and peppermint oil on it.  She says it will get better for a couple of days and then she will quit treating it and then it seems to get worse.  Right now it is extremely tender and painful and hard to even touch.  She says occasionally it will bleed.  She denies any trauma or injury.  She denies any changes in soap, lotion, shampoo, detergent etc.  She says it also itches intensely at times.  She also complains of chronic and constant inner ear itching.  She feels like it is in the canals.  At one point we had discussed this and I had suggested maybe using a dust mite covers on her pillowcase to see if some of it could be allergy related.  She said at one point she got put on prednisone for a pinched nerve and says that actually helped with her ear itching.  She denies any excess flaking or wax in her ears.  Past Medical History:  Diagnosis Date  . GERD (gastroesophageal reflux disease)     No past surgical history on file.  Family History  Problem Relation Age of Onset  . Lung cancer Father 20       smoker  . Heart disease Mother        family history  . Diabetes Maternal Grandmother   . Hypertension Maternal Grandfather   . Lung cancer Paternal Grandfather   . Thyroid cancer Son   . Leukemia Grandchild     Social History   Socioeconomic History  . Marital status: Married    Spouse name: Jesus   . Number of children: 10  . Years of education: 57  . Highest education level: 12th grade  Occupational History  . Occupation: Civil engineer, contracting: UNEMPLOYED  Social Needs  . Financial resource strain: Not hard at all  . Food insecurity:    Worry: Never true    Inability: Never true  . Transportation needs:    Medical: No    Non-medical: No  Tobacco Use  . Smoking status: Never Smoker  . Smokeless tobacco: Never Used  Substance and Sexual Activity  . Alcohol use: No  . Drug use: No  . Sexual activity: Yes    Partners: Male  Lifestyle  . Physical activity:    Days per week: 3 days    Minutes per session: 30 min  . Stress: Not at all  Relationships  . Social connections:    Talks on phone: Once a week    Gets together: Once a week    Attends religious service: More than 4 times per year    Active member of club or organization: No    Attends meetings of clubs or organizations: Never    Relationship status: Married  . Intimate partner violence:    Fear of current or ex partner: No    Emotionally abused: No    Physically abused:  No    Forced sexual activity: No  Other Topics Concern  . Not on file  Social History Narrative   . Walks for exercise. Rides stationary bike at home for exercise. Drinks green tea during the day    Outpatient Medications Prior to Visit  Medication Sig Dispense Refill  . AMBULATORY NON FORMULARY MEDICATION Medication Name: chia seeds    . AMBULATORY NON FORMULARY MEDICATION Medication Name: flax seeds    . Ascorbic Acid (VITAMIN C) 1000 MG tablet Take 1,000 mg by mouth daily.    . B Complex Vitamins (VITAMIN B COMPLEX PO) Take by mouth.    . Cholecalciferol (VITAMIN D3) 1000 units CAPS Take 4,000 Int'l Units by mouth.    . CHOLINE PO Take 550 mg by mouth.    . Coenzyme Q10 (CO Q 10) 100 MG CAPS Take by mouth.    . FOLIC ACID PO Take by mouth.    Javier Docker Oil 500 MG CAPS Take 500 mg by mouth.    . Lutein 20 MG CAPS Take by mouth 1 day or 1 dose.    . magnesium 30 MG tablet Take 30 mg by mouth 2 (two) times daily.    . Multiple Vitamins-Minerals (ZINC PO) Take by mouth.    .  SPIRULINA PO Take 400 mg by mouth.    . vitamin E 400 UNIT capsule Take 400 Units by mouth daily.     No facility-administered medications prior to visit.     Allergies  Allergen Reactions  . Other Other (See Comments)    Pineapple,honeydew: causes sore throat and sores on tongue    ROS     Objective:    Physical Exam  Constitutional: She is oriented to person, place, and time. She appears well-developed and well-nourished.  HENT:  Head: Normocephalic and atraumatic.  I did examine the left ear canal.  I did not see any rash or erythema or excess dead skin.  A small amount of normal wax.  Eyes: Conjunctivae and EOM are normal.  Cardiovascular: Normal rate.  Pulmonary/Chest: Effort normal.  Neurological: She is alert and oriented to person, place, and time.  Skin: Skin is dry. No pallor.  On the skin behind her left ear close to where the cartilage and the head meet she has an oval-shaped approximately 3 mm tear in the skin.  It appears to have a little bit of blood on the surface.  On her hair around the area she has some dried yellow crust.  No significant surrounding erythema.  I do not see any inflammation of the cartilage itself.  No pus.  Psychiatric: She has a normal mood and affect. Her behavior is normal.  Vitals reviewed.   BP 122/66   Pulse 74   Ht 5\' 2"  (1.575 m)   Wt 180 lb (81.6 kg)   SpO2 96%   BMI 32.92 kg/m  Wt Readings from Last 3 Encounters:  12/18/18 180 lb (81.6 kg)  07/04/18 172 lb (78 kg)  06/01/18 168 lb 11.2 oz (76.5 kg)    Health Maintenance Due  Topic Date Due  . COLONOSCOPY  04/03/2001    There are no preventive care reminders to display for this patient.   Lab Results  Component Value Date   TSH 0.49 10/25/2017   Lab Results  Component Value Date   WBC 6.6 06/05/2018   HGB 15.4 06/05/2018   HCT 46.5 06/05/2018   MCV 86 06/05/2018   PLT 257 06/05/2018  Lab Results  Component Value Date   NA 142 06/05/2018   K 4.5  06/05/2018   CO2 23 06/05/2018   GLUCOSE 99 06/05/2018   BUN 7 (L) 06/05/2018   CREATININE 0.70 06/05/2018   BILITOT 0.4 06/05/2018   ALKPHOS 67 06/05/2018   AST 23 06/05/2018   ALT 20 06/05/2018   PROT 7.6 06/05/2018   ALBUMIN 4.9 (H) 06/05/2018   CALCIUM 10.1 06/05/2018   Lab Results  Component Value Date   CHOL 212 (H) 06/05/2018   Lab Results  Component Value Date   HDL 58 06/05/2018   Lab Results  Component Value Date   LDLCALC 131 (H) 06/05/2018   Lab Results  Component Value Date   TRIG 115 06/05/2018   Lab Results  Component Value Date   CHOLHDL 3.7 06/05/2018   Lab Results  Component Value Date   HGBA1C 5.7 (H) 10/25/2017       Assessment & Plan:   Problem List Items Addressed This Visit    None    Visit Diagnoses    Open wound of left ear, unspecified open wound type, initial encounter    -  Primary   Dermatitis of ear canal, bilateral           Meds ordered this encounter  Medications  . doxycycline (VIBRA-TABS) 100 MG tablet    Sig: Take 1 tablet (100 mg total) by mouth 2 (two) times daily.    Dispense:  20 tablet    Refill:  0  . Fluocinolone Acetonide 0.01 % OIL    Sig: Place 1 application in ear(s) at bedtime as needed.    Dispense:  20 mL    Refill:  0  . mupirocin ointment (BACTROBAN) 2 %    Sig: Apply topically 2 (two) times daily for 10 days.    Dispense:  30 g    Refill:  0     Beatrice Lecher, MD

## 2018-12-18 NOTE — Patient Instructions (Signed)
Have your daughter check the wound if it is not healing over the next 7 to 10 days then please let me know.  The plan will be to refer you to dermatology if it is not improving. Avoid using any drying products such as alcohol, peroxide, etc.  Just use gentle soap.  Pat dry, and apply the mupirocin ointment twice a day.

## 2018-12-26 ENCOUNTER — Telehealth: Payer: Self-pay

## 2018-12-26 DIAGNOSIS — S01302A Unspecified open wound of left ear, initial encounter: Secondary | ICD-10-CM

## 2018-12-26 NOTE — Telephone Encounter (Signed)
Journe called and states she will need the referral to dermatology. The wound has not closed. Please advise.     Salomon Mast, [facility]  Address: [address], [place], [place] [address]  Phone: [phone]

## 2018-12-26 NOTE — Telephone Encounter (Signed)
[facility] for referal. Thank you

## 2018-12-27 NOTE — Telephone Encounter (Signed)
Referral placed.

## 2019-01-04 DIAGNOSIS — L729 Follicular cyst of the skin and subcutaneous tissue, unspecified: Secondary | ICD-10-CM | POA: Diagnosis not present

## 2019-01-04 DIAGNOSIS — D1801 Hemangioma of skin and subcutaneous tissue: Secondary | ICD-10-CM | POA: Diagnosis not present

## 2019-01-04 DIAGNOSIS — D485 Neoplasm of uncertain behavior of skin: Secondary | ICD-10-CM | POA: Diagnosis not present

## 2019-01-04 DIAGNOSIS — L700 Acne vulgaris: Secondary | ICD-10-CM | POA: Diagnosis not present

## 2019-01-04 DIAGNOSIS — L255 Unspecified contact dermatitis due to plants, except food: Secondary | ICD-10-CM | POA: Diagnosis not present

## 2019-07-11 NOTE — Progress Notes (Deleted)
Subjective:   Gina Norton is a 68 y.o. female who presents for Medicare Annual (Subsequent) preventive examination.  Review of Systems:  No ROS.  Medicare Wellness Virtual Visit.  Visual/audio telehealth visit, UTA vital signs.   See social history for additional risk factors.      Sleep patterns:  Home Safety/Smoke Alarms: Feels safe in home. Smoke alarms in place.  Living environment;  Seat Belt Safety/Bike Helmet: Wears seat belt.   Female:   Pap-  Aged out     Mammo-       Dexa scan-  UTD      CCS-     Objective:     Vitals: There were no vitals taken for this visit.  There is no height or weight on file to calculate BMI.  Advanced Directives 07/04/2018 06/12/2018  Does Patient Have a Medical Advance Directive? No No  Would patient like information on creating a medical advance directive? Yes (MAU/Ambulatory/Procedural Areas - Information given) No - Patient declined    Tobacco Social History   Tobacco Use  Smoking Status Never Smoker  Smokeless Tobacco Never Used     Counseling given: Not Answered   Clinical Intake:                       Past Medical History:  Diagnosis Date  . GERD (gastroesophageal reflux disease)    No past surgical history on file. Family History  Problem Relation Age of Onset  . Lung cancer Father 24       smoker  . Heart disease Mother        family history  . Diabetes Maternal Grandmother   . Hypertension Maternal Grandfather   . Lung cancer Paternal Grandfather   . Thyroid cancer Son   . Leukemia Grandchild    Social History   Socioeconomic History  . Marital status: Married    Spouse name: Jesus   . Number of children: 10  . Years of education: 4  . Highest education level: 12th grade  Occupational History  . Occupation: Scientist, research (physical sciences): UNEMPLOYED  Social Needs  . Financial resource strain: Not hard at all  . Food insecurity    Worry: Never true    Inability: Never true  .  Transportation needs    Medical: No    Non-medical: No  Tobacco Use  . Smoking status: Never Smoker  . Smokeless tobacco: Never Used  Substance and Sexual Activity  . Alcohol use: No  . Drug use: No  . Sexual activity: Yes    Partners: Male  Lifestyle  . Physical activity    Days per week: 3 days    Minutes per session: 30 min  . Stress: Not at all  Relationships  . Social Herbalist on phone: Once a week    Gets together: Once a week    Attends religious service: More than 4 times per year    Active member of club or organization: No    Attends meetings of clubs or organizations: Never    Relationship status: Married  Other Topics Concern  . Not on file  Social History Narrative   . Walks for exercise. Rides stationary bike at home for exercise. Drinks green tea during the day    Outpatient Encounter Medications as of 07/16/2019  Medication Sig  . AMBULATORY NON FORMULARY MEDICATION Medication Name: chia seeds  . AMBULATORY NON FORMULARY MEDICATION Medication Name: flax  seeds  . Ascorbic Acid (VITAMIN C) 1000 MG tablet Take 1,000 mg by mouth daily.  . B Complex Vitamins (VITAMIN B COMPLEX PO) Take by mouth.  . Cholecalciferol (VITAMIN D3) 1000 units CAPS Take 4,000 Int'l Units by mouth.  . CHOLINE PO Take 550 mg by mouth.  . Coenzyme Q10 (CO Q 10) 100 MG CAPS Take by mouth.  . doxycycline (VIBRA-TABS) 100 MG tablet Take 1 tablet (100 mg total) by mouth 2 (two) times daily.  . Fluocinolone Acetonide 0.01 % OIL Place 1 application in ear(s) at bedtime as needed.  Marland Kitchen FOLIC ACID PO Take by mouth.  Javier Docker Oil 500 MG CAPS Take 500 mg by mouth.  . Lutein 20 MG CAPS Take by mouth 1 day or 1 dose.  . magnesium 30 MG tablet Take 30 mg by mouth 2 (two) times daily.  . Multiple Vitamins-Minerals (ZINC PO) Take by mouth.  . SPIRULINA PO Take 400 mg by mouth.  . vitamin E 400 UNIT capsule Take 400 Units by mouth daily.   No facility-administered encounter medications on  file as of 07/16/2019.     Activities of Daily Living No flowsheet data found.  Patient Care Team: Hali Marry, MD as PCP - General (Family Medicine)    Assessment:   This is a routine wellness examination for [facility].Physical assessment deferred to PCP.   Exercise Activities and Dietary recommendations   Diet  Breakfast: Lunch:  Dinner:       Goals    . Exercise 3x per week (30 min per time)     Exercise at least 3 times a day for 30 minute at a time. Plans on joining silver sneakers at the first of the year.       Fall Risk Fall Risk  07/04/2018 10/25/2017  Falls in the past year? 0 No  Follow up Falls prevention discussed -   Is the patient's home free of loose throw rugs in walkways, pet beds, electrical cords, etc?   {Blank single:19197::"yes","no"}      Grab bars in the bathroom? {Blank single:19197::"yes","no"}      Handrails on the stairs?   {Blank single:19197::"yes","no"}      Adequate lighting?   {Blank single:19197::"yes","no"}  Depression Screen PHQ 2/9 Scores 07/04/2018 06/01/2018 10/25/2017  PHQ - 2 Score 0 0 0  PHQ- 9 Score 0 1 -     Cognitive Function     6CIT Screen 07/04/2018  What Year? 0 points  What month? 0 points  What time? 0 points  Count back from 20 0 points  Months in reverse 0 points  Repeat phrase 0 points  Total Score 0     There is no immunization history on file for this patient.   Screening Tests Health Maintenance  Topic Date Due  . COLONOSCOPY  04/03/2001  . MAMMOGRAM  06/10/2012  . INFLUENZA VACCINE  03/03/2019  . PNA vac Low Risk Adult (1 of 2 - PCV13) 10/26/2019 (Originally 04/03/2016)  . TETANUS/TDAP  02/11/2021  . DEXA SCAN  Completed  . Hepatitis C Screening  Completed        Plan:   ***   I have personally reviewed and noted the following in the patient's chart:   . Medical and social history . Use of alcohol, tobacco or illicit drugs  . Current medications and supplements . Functional ability  and status . Nutritional status . Physical activity . Advanced directives . List of other physicians . Hospitalizations, surgeries, and  ER visits in previous 12 months . Vitals . Screenings to include cognitive, depression, and falls . Referrals and appointments  In addition, I have reviewed and discussed with patient certain preventive protocols, quality metrics, and best practice recommendations. A written personalized care plan for preventive services as well as general preventive health recommendations were provided to patient.     Joanne Chars, LPN  [id number]

## 2019-07-15 IMAGING — DX DG CERVICAL SPINE 2 OR 3 VIEWS
4 series · 4 of 4 positions shown · non-contrast
Comparison: None.

CLINICAL DATA: Chronic neck pain.  No known recent injury.

EXAM:
CERVICAL SPINE - 2-3 VIEW

[c-spine lat]
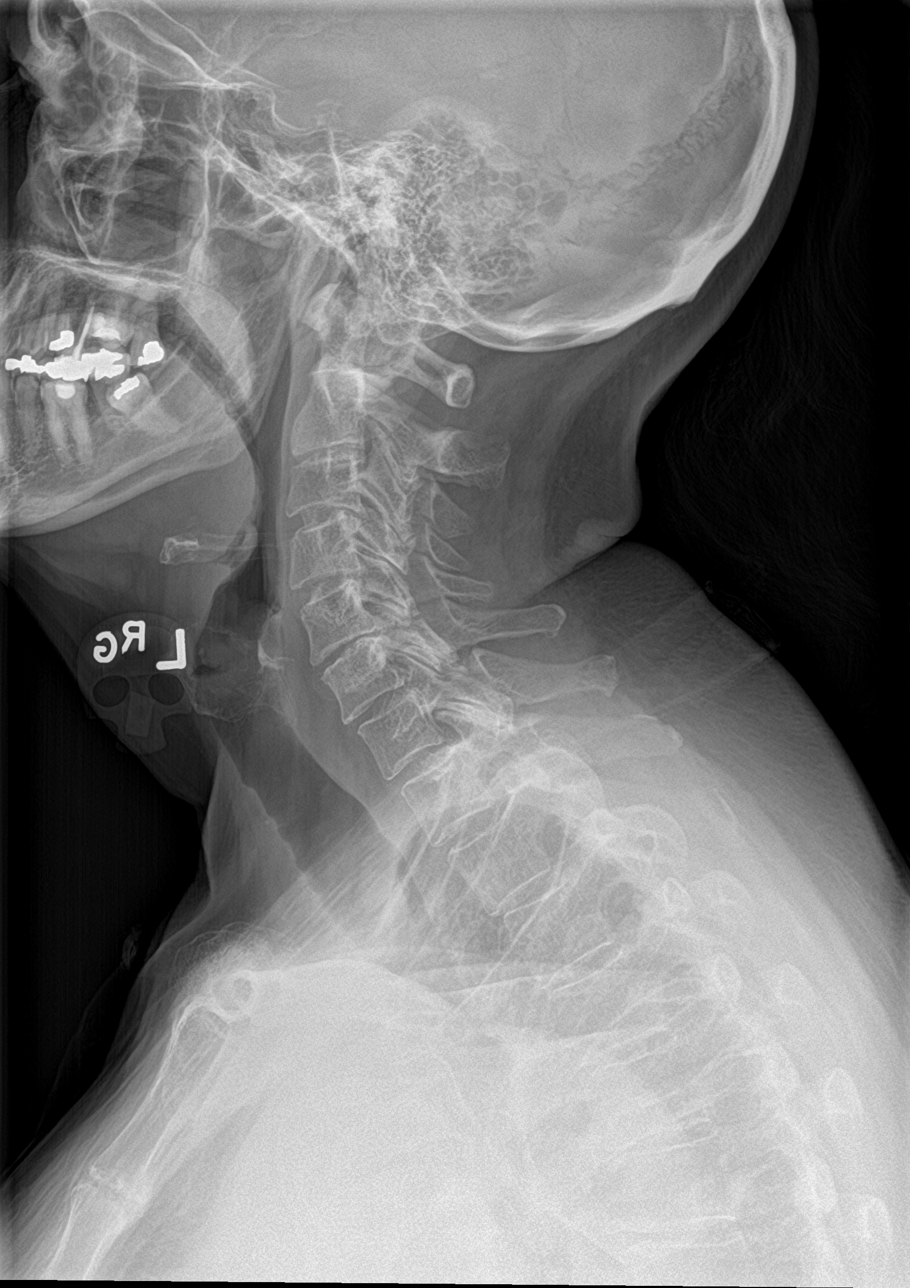

[c-spine ap]
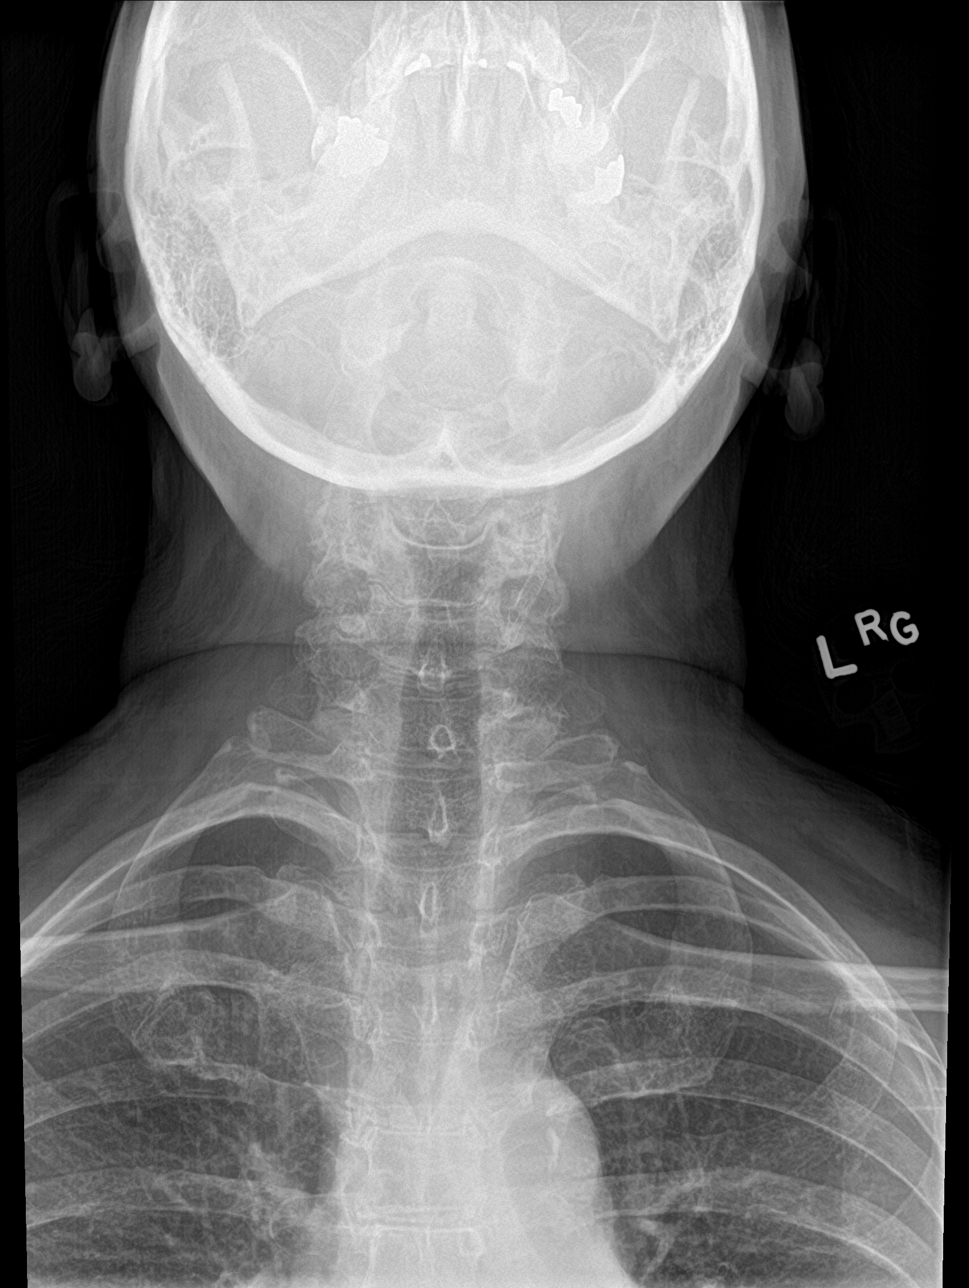

[c-spine open mouth]
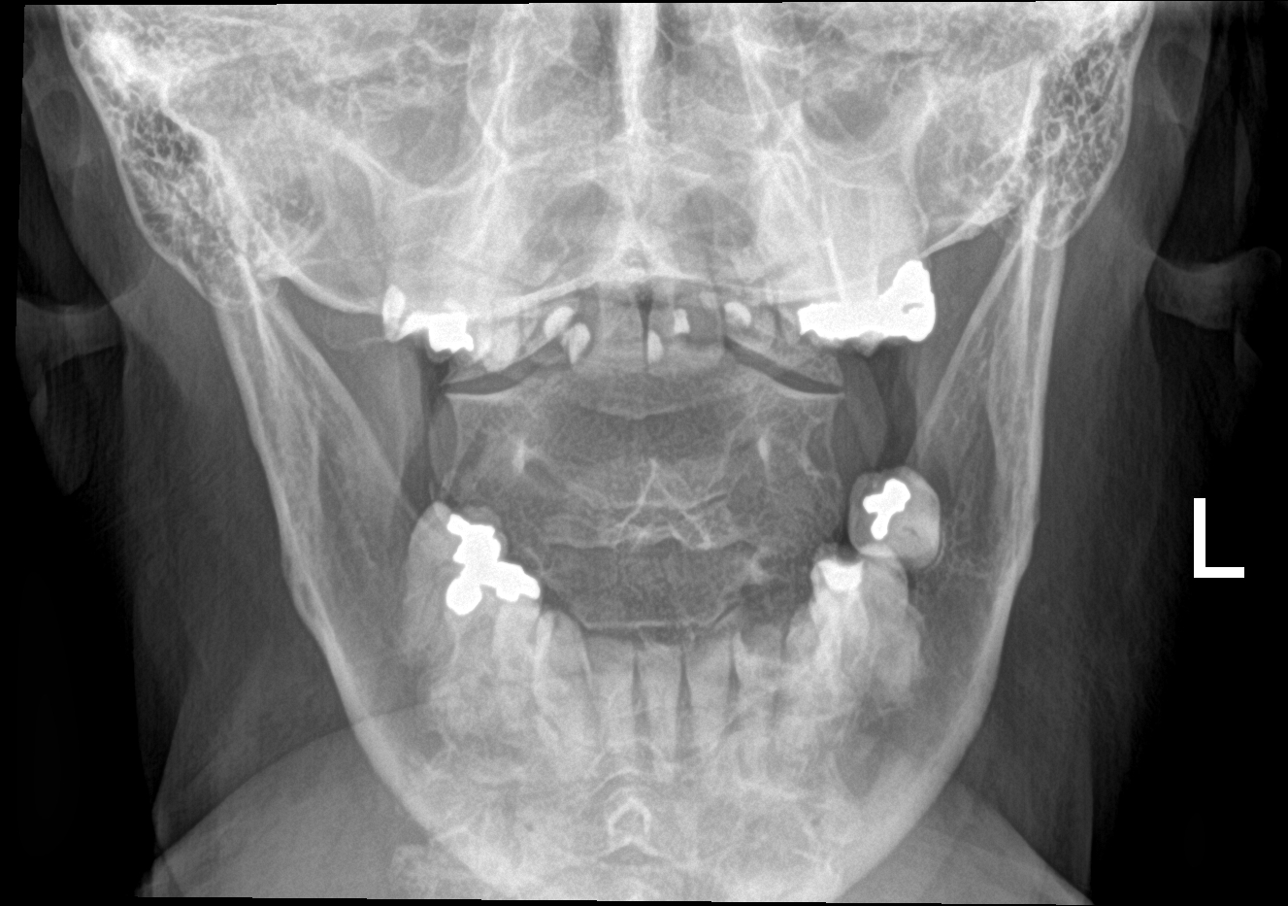

[c-spine swimmers]
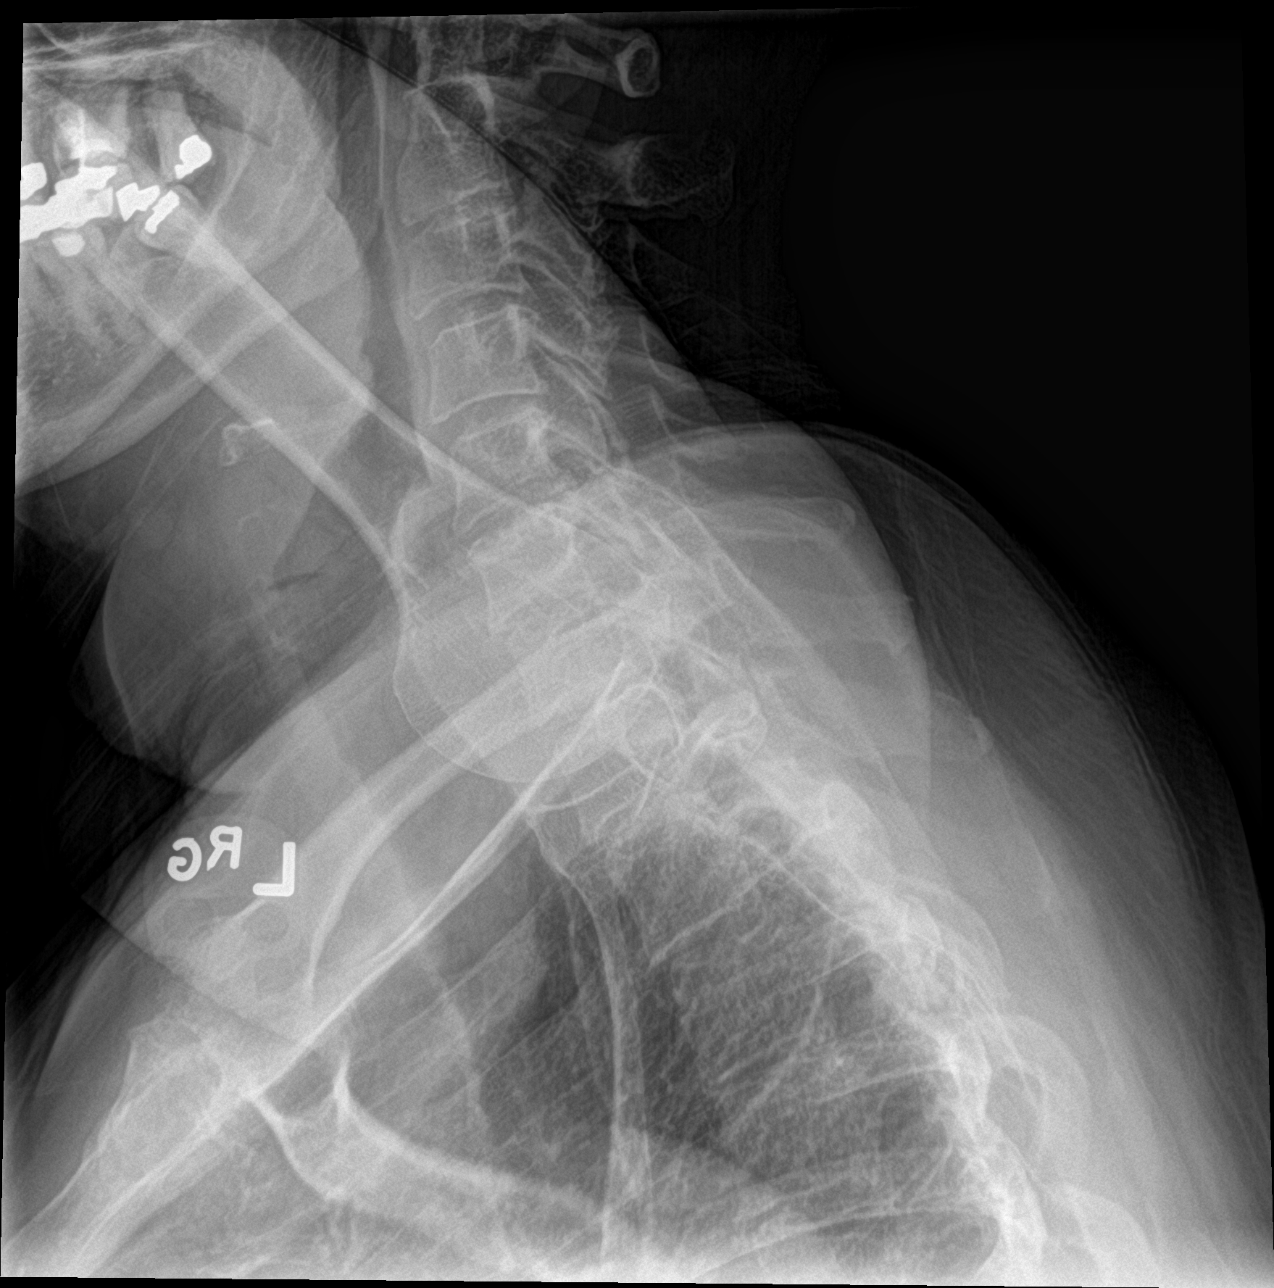

[4 of 4 positions shown; findings below may reference images not displayed]

FINDINGS: Mild degenerative spondylosis at the C2-3 and C5-6 levels with
associated disc space narrowings and mild osseous spurring.
Additional degenerative uncovertebral joint hypertrophy bilaterally
at multiple levels, mild to moderate in degree. Associated mild
retrolisthesis of C3.

No acute or suspicious osseous finding. Prevertebral soft tissues
are normal in thickness.
IMPRESSION: 1. Degenerative spondylosis of the cervical spine, mild to moderate
in degree, as detailed above.
2. No acute findings.

## 2019-07-16 ENCOUNTER — Ambulatory Visit: Payer: Medicare Other

## 2019-07-24 NOTE — Progress Notes (Deleted)
Subjective:   Gina Norton is a 68 y.o. female who presents for Medicare Annual (Subsequent) preventive examination.  Review of Systems:  No ROS.  Medicare Wellness Virtual Visit.  Visual/audio telehealth visit, UTA vital signs.   See social history for additional risk factors.      Sleep patterns:  Home Safety/Smoke Alarms: Feels safe in home. Smoke alarms in place.  Living environment; Seat Belt Safety/Bike Helmet: Wears seat belt.   Female:   Pap- Aged out      Mammo-       Dexa scan-  UTD      CCS-     Objective:     Vitals: There were no vitals taken for this visit.  There is no height or weight on file to calculate BMI.  Advanced Directives 07/04/2018 06/12/2018  Does Patient Have a Medical Advance Directive? No No  Would patient like information on creating a medical advance directive? Yes (MAU/Ambulatory/Procedural Areas - Information given) No - Patient declined    Tobacco Social History   Tobacco Use  Smoking Status Never Smoker  Smokeless Tobacco Never Used     Counseling given: Not Answered   Clinical Intake:                       Past Medical History:  Diagnosis Date  . GERD (gastroesophageal reflux disease)    No past surgical history on file. Family History  Problem Relation Age of Onset  . Lung cancer Father 70       smoker  . Heart disease Mother        family history  . Diabetes Maternal Grandmother   . Hypertension Maternal Grandfather   . Lung cancer Paternal Grandfather   . Thyroid cancer Son   . Leukemia Grandchild    Social History   Socioeconomic History  . Marital status: Married    Spouse name: Jesus   . Number of children: 10  . Years of education: 48  . Highest education level: 12th grade  Occupational History  . Occupation: Scientist, research (physical sciences): UNEMPLOYED  Tobacco Use  . Smoking status: Never Smoker  . Smokeless tobacco: Never Used  Substance and Sexual Activity  . Alcohol use: No  . Drug  use: No  . Sexual activity: Yes    Partners: Male  Other Topics Concern  . Not on file  Social History Narrative   . Walks for exercise. Rides stationary bike at home for exercise. Drinks green tea during the day   Social Determinants of Health   Financial Resource Strain:   . Difficulty of Paying Living Expenses: Not on file  Food Insecurity:   . Worried About Charity fundraiser in the Last Year: Not on file  . Ran Out of Food in the Last Year: Not on file  Transportation Needs:   . Lack of Transportation (Medical): Not on file  . Lack of Transportation (Non-Medical): Not on file  Physical Activity:   . Days of Exercise per Week: Not on file  . Minutes of Exercise per Session: Not on file  Stress:   . Feeling of Stress : Not on file  Social Connections:   . Frequency of Communication with Friends and Family: Not on file  . Frequency of Social Gatherings with Friends and Family: Not on file  . Attends Religious Services: Not on file  . Active Member of Clubs or Organizations: Not on file  . Attends  Club or Organization Meetings: Not on file  . Marital Status: Not on file    Outpatient Encounter Medications as of 08/06/2019  Medication Sig  . AMBULATORY NON FORMULARY MEDICATION Medication Name: chia seeds  . AMBULATORY NON FORMULARY MEDICATION Medication Name: flax seeds  . Ascorbic Acid (VITAMIN C) 1000 MG tablet Take 1,000 mg by mouth daily.  . B Complex Vitamins (VITAMIN B COMPLEX PO) Take by mouth.  . Cholecalciferol (VITAMIN D3) 1000 units CAPS Take 4,000 Int'l Units by mouth.  . CHOLINE PO Take 550 mg by mouth.  . Coenzyme Q10 (CO Q 10) 100 MG CAPS Take by mouth.  . doxycycline (VIBRA-TABS) 100 MG tablet Take 1 tablet (100 mg total) by mouth 2 (two) times daily.  . Fluocinolone Acetonide 0.01 % OIL Place 1 application in ear(s) at bedtime as needed.  Marland Kitchen FOLIC ACID PO Take by mouth.  Javier Docker Oil 500 MG CAPS Take 500 mg by mouth.  . Lutein 20 MG CAPS Take by mouth 1 day  or 1 dose.  . magnesium 30 MG tablet Take 30 mg by mouth 2 (two) times daily.  . Multiple Vitamins-Minerals (ZINC PO) Take by mouth.  . SPIRULINA PO Take 400 mg by mouth.  . vitamin E 400 UNIT capsule Take 400 Units by mouth daily.   No facility-administered encounter medications on file as of 08/06/2019.    Activities of Daily Living No flowsheet data found.  Patient Care Team: Hali Marry, MD as PCP - General (Family Medicine)    Assessment:   This is a routine wellness examination for [facility].Physical assessment deferred to PCP.   Exercise Activities and Dietary recommendations   Diet  Breakfast: Lunch:  Dinner:       Goals    . Exercise 3x per week (30 min per time)     Exercise at least 3 times a day for 30 minute at a time. Plans on joining silver sneakers at the first of the year.       Fall Risk Fall Risk  07/04/2018 10/25/2017  Falls in the past year? 0 No  Follow up Falls prevention discussed -   Is the patient's home free of loose throw rugs in walkways, pet beds, electrical cords, etc?   {Blank single:19197::"yes","no"}      Grab bars in the bathroom? {Blank single:19197::"yes","no"}      Handrails on the stairs?   {Blank single:19197::"yes","no"}      Adequate lighting?   {Blank single:19197::"yes","no"}  Depression Screen PHQ 2/9 Scores 07/04/2018 06/01/2018 10/25/2017  PHQ - 2 Score 0 0 0  PHQ- 9 Score 0 1 -     Cognitive Function     6CIT Screen 07/04/2018  What Year? 0 points  What month? 0 points  What time? 0 points  Count back from 20 0 points  Months in reverse 0 points  Repeat phrase 0 points  Total Score 0     There is no immunization history on file for this patient.   Screening Tests Health Maintenance  Topic Date Due  . COLONOSCOPY  04/03/2001  . MAMMOGRAM  06/10/2012  . INFLUENZA VACCINE  03/03/2019  . PNA vac Low Risk Adult (1 of 2 - PCV13) 10/26/2019 (Originally 04/03/2016)  . TETANUS/TDAP  02/11/2021  . DEXA SCAN   Completed  . Hepatitis C Screening  Completed       Plan:   ***   I have personally reviewed and noted the following in the patient's chart:   .  Medical and social history . Use of alcohol, tobacco or illicit drugs  . Current medications and supplements . Functional ability and status . Nutritional status . Physical activity . Advanced directives . List of other physicians . Hospitalizations, surgeries, and ER visits in previous 12 months . Vitals . Screenings to include cognitive, depression, and falls . Referrals and appointments  In addition, I have reviewed and discussed with patient certain preventive protocols, quality metrics, and best practice recommendations. A written personalized care plan for preventive services as well as general preventive health recommendations were provided to patient.     Joanne Chars, LPN  [id number]

## 2019-08-06 ENCOUNTER — Ambulatory Visit (INDEPENDENT_AMBULATORY_CARE_PROVIDER_SITE_OTHER): Payer: Medicare HMO | Admitting: *Deleted

## 2019-08-06 VITALS — BP 132/92 | HR 98 | Ht 62.0 in | Wt 170.0 lb

## 2019-08-06 DIAGNOSIS — Z0000 Encounter for general adult medical examination without abnormal findings: Secondary | ICD-10-CM | POA: Diagnosis not present

## 2019-08-06 NOTE — Patient Instructions (Signed)
Please schedule your next medicare wellness visit with me in 1 yr.  Gina Norton , Thank you for taking time to come for your Medicare Wellness Visit. I appreciate your ongoing commitment to your health goals. Please review the following plan we discussed and let me know if I can assist you in the future.  Continue doing brain stimulating activities (puzzles, reading, adult coloring books, staying active) to keep memory sharp.  These are the goals we discussed: Goals    . Exercise 3x per week (30 min per time)     Exercise at least 3 times a day for 30 minute at a time. Plans on joining silver sneakers at the first of the year.

## 2019-08-06 NOTE — Progress Notes (Signed)
Subjective:   Gina Norton is a 69 y.o. female who presents for Medicare Annual (Subsequent) preventive examination.  Review of Systems:  No ROS.  Medicare Wellness Virtual Visit.  Visual/audio telehealth visit, UTA vital signs.   See social history for additional risk factors.    Cardiac Risk Factors include: advanced age (>83men, >5 women);family history of premature cardiovascular disease Sleep patterns: Getting 7-8 hours of sleep a night. Wakes up rarely to void during the night.Wakes up and feels rested and ready for the day.   Home Safety/Smoke Alarms: Feels safe in home. Smoke alarms in place.  Living environment; Lives alone right now as husband went to [place] to take care of sister who had a stroke. Steps going into home have a handrail in place. Shower is a walk in shower and no grab bars in place. Seat Belt Safety/Bike Helmet: Wears seat belt.   Female:   Pap- Aged out       Mammo- Declined      Dexa scan- UTD       CCS- declined    Objective:     Vitals: BP (!) 132/92   Pulse 98   Ht 5\' 2"  (1.575 m)   Wt 170 lb (77.1 kg)   BMI 31.09 kg/m   Body mass index is 31.09 kg/m.  Advanced Directives 08/06/2019 07/04/2018 06/12/2018  Does Patient Have a Medical Advance Directive? No No No  Would patient like information on creating a medical advance directive? No - Patient declined Yes (MAU/Ambulatory/Procedural Areas - Information given) No - Patient declined    Tobacco Social History   Tobacco Use  Smoking Status Never Smoker  Smokeless Tobacco Never Used     Counseling given: No   Clinical Intake:  Pre-visit preparation completed: Yes  Pain : No/denies pain     Nutritional Risks: None Diabetes: No  How often do you need to have someone help you when you read instructions, pamphlets, or other written materials from your doctor or pharmacy?: 1 - Never What is the last grade level you completed in school?: 12  Interpreter Needed?: No  Information  entered by :: Orlie Dakin, LPN  Past Medical History:  Diagnosis Date  . GERD (gastroesophageal reflux disease)    History reviewed. No pertinent surgical history. Family History  Problem Relation Age of Onset  . Lung cancer Father 55       smoker  . Heart disease Mother        family history  . Diabetes Maternal Grandmother   . Hypertension Maternal Grandfather   . Lung cancer Paternal Grandfather   . Thyroid cancer Son   . Leukemia Grandchild    Social History   Socioeconomic History  . Marital status: Married    Spouse name: Gina Norton   . Number of children: 10  . Years of education: 77  . Highest education level: 12th grade  Occupational History  . Occupation: Scientist, research (physical sciences): UNEMPLOYED  Tobacco Use  . Smoking status: Never Smoker  . Smokeless tobacco: Never Used  Substance and Sexual Activity  . Alcohol use: No  . Drug use: No  . Sexual activity: Yes    Partners: Male    Comment: husband has ED  Other Topics Concern  . Not on file  Social History Narrative    Drinks green tea during the day. No exercising since gym closed due to [place].   Social Determinants of Health   Financial Resource Strain:   .  Difficulty of Paying Living Expenses: Not on file  Food Insecurity:   . Worried About Charity fundraiser in the Last Year: Not on file  . Ran Out of Food in the Last Year: Not on file  Transportation Needs:   . Lack of Transportation (Medical): Not on file  . Lack of Transportation (Non-Medical): Not on file  Physical Activity:   . Days of Exercise per Week: Not on file  . Minutes of Exercise per Session: Not on file  Stress:   . Feeling of Stress : Not on file  Social Connections:   . Frequency of Communication with Friends and Family: Not on file  . Frequency of Social Gatherings with Friends and Family: Not on file  . Attends Religious Services: Not on file  . Active Member of Clubs or Organizations: Not on file  . Attends Archivist  Meetings: Not on file  . Marital Status: Not on file    Outpatient Encounter Medications as of 08/06/2019  Medication Sig  . AMBULATORY NON FORMULARY MEDICATION Medication Name: chia seeds  . AMBULATORY NON FORMULARY MEDICATION Medication Name: flax seeds  . AMBULATORY NON FORMULARY MEDICATION Holy Basil 500mg   . Ascorbic Acid (VITAMIN C) 1000 MG tablet Take 1,000 mg by mouth daily.  . B Complex Vitamins (VITAMIN B COMPLEX PO) Take by mouth.  . Cholecalciferol (VITAMIN D3) 1000 units CAPS Take 4,000 Int'l Units by mouth.  . CHOLINE PO Take 550 mg by mouth.  . Coenzyme Q10 (CO Q 10) 100 MG CAPS Take by mouth.  . FOLIC ACID PO Take by mouth.  Javier Docker Oil 500 MG CAPS Take 500 mg by mouth.  . Lutein 20 MG CAPS Take by mouth 1 day or 1 dose.  . magnesium 30 MG tablet Take 30 mg by mouth 2 (two) times daily.  . Menaquinone-7 (VITAMIN K2) 100 MCG CAPS Take by mouth.  . Multiple Vitamins-Minerals (ZINC PO) Take by mouth.  . SPIRULINA PO Take 400 mg by mouth.  . doxycycline (VIBRA-TABS) 100 MG tablet Take 1 tablet (100 mg total) by mouth 2 (two) times daily. (Patient not taking: Reported on 08/06/2019)  . Fluocinolone Acetonide 0.01 % OIL Place 1 application in ear(s) at bedtime as needed. (Patient not taking: Reported on 08/06/2019)  . vitamin E 400 UNIT capsule Take 400 Units by mouth daily.   No facility-administered encounter medications on file as of 08/06/2019.    Activities of Daily Living In your present state of health, do you have any difficulty performing the following activities: 08/06/2019  Hearing? N  Vision? N  Difficulty concentrating or making decisions? N  Walking or climbing stairs? N  Dressing or bathing? N  Doing errands, shopping? N  Preparing Food and eating ? N  Using the Toilet? N  In the past six months, have you accidently leaked urine? N  Do you have problems with loss of bowel control? N  Managing your Medications? N  Managing your Finances? N  Housekeeping or  managing your Housekeeping? N  Some recent data might be hidden    Patient Care Team: Hali Marry, MD as PCP - General (Family Medicine)    Assessment:   This is a routine wellness examination for [facility].Physical assessment deferred to PCP.   Exercise Activities and Dietary recommendations Current Exercise Habits: The patient does not participate in regular exercise at present, Exercise limited by: None identified Diet Eats a healthy diet of vegetables, fruits and proteins. Breakfast: Oatmeal  with banana and nuts Lunch: skips Dinner: Meat and vegetables, sweet potato   Drinks tea during the day and no water.   Goals    . Exercise 3x per week (30 min per time)     Exercise at least 3 times a day for 30 minute at a time. Plans on joining silver sneakers at the first of the year.       Fall Risk Fall Risk  08/06/2019 07/04/2018 10/25/2017  Falls in the past year? 0 0 No  Number falls in past yr: 0 - -  Injury with Fall? 0 - -  Risk for fall due to : No Fall Risks - -  Follow up Falls prevention discussed Falls prevention discussed -   Is the patient's home free of loose throw rugs in walkways, pet beds, electrical cords, etc?   yes      Grab bars in the bathroom? no      Handrails on the stairs?   yes      Adequate lighting?   yes   Depression Screen PHQ 2/9 Scores 08/06/2019 07/04/2018 06/01/2018 10/25/2017  PHQ - 2 Score 0 0 0 0  PHQ- 9 Score - 0 1 -     Cognitive Function     6CIT Screen 08/06/2019 07/04/2018  What Year? 0 points 0 points  What month? 0 points 0 points  What time? 0 points 0 points  Count back from 20 0 points 0 points  Months in reverse 0 points 0 points  Repeat phrase 0 points 0 points  Total Score 0 0     There is no immunization history on file for this patient.   Screening Tests Health Maintenance  Topic Date Due  . PNA vac Low Risk Adult (1 of 2 - PCV13) 10/26/2019 (Originally 04/03/2016)  . INFLUENZA VACCINE  10/31/2019 (Originally  03/03/2019)  . MAMMOGRAM  08/05/2020 (Originally 06/10/2012)  . COLONOSCOPY  08/05/2020 (Originally 04/03/2001)  . TETANUS/TDAP  02/11/2021  . DEXA SCAN  Completed  . Hepatitis C Screening  Completed       Plan:    Please schedule your next medicare wellness visit with me in 1 yr.  Ms. Leiker , Thank you for taking time to come for your Medicare Wellness Visit. I appreciate your ongoing commitment to your health goals. Please review the following plan we discussed and let me know if I can assist you in the future.  Continue doing brain stimulating activities (puzzles, reading, adult coloring books, staying active) to keep memory sharp.    These are the goals we discussed: Goals    . Exercise 3x per week (30 min per time)     Exercise at least 3 times a day for 30 minute at a time. Plans on joining silver sneakers at the first of the year.       This is a list of the screening recommended for you and due dates:  Health Maintenance  Topic Date Due  . Pneumonia vaccines (1 of 2 - PCV13) 10/26/2019*  . Flu Shot  10/31/2019*  . Mammogram  08/05/2020*  . Colon Cancer Screening  08/05/2020*  . Tetanus Vaccine  02/11/2021  . DEXA scan (bone density measurement)  Completed  .  Hepatitis C: One time screening is recommended by Center for Disease Control  (CDC) for  adults born from [birth year] through 1965.   Completed  *Topic was postponed. The date shown is not the original due date.  I have personally reviewed and noted the following in the patient's chart:   . Medical and social history . Use of alcohol, tobacco or illicit drugs  . Current medications and supplements . Functional ability and status . Nutritional status . Physical activity . Advanced directives . List of other physicians . Hospitalizations, surgeries, and ER visits in previous 12 months . Vitals . Screenings to include cognitive, depression, and falls . Referrals and appointments  In addition, I have reviewed  and discussed with patient certain preventive protocols, quality metrics, and best practice recommendations. A written personalized care plan for preventive services as well as general preventive health recommendations were provided to patient.     Joanne Chars, LPN  [id number]

## 2019-08-06 NOTE — Progress Notes (Signed)
Medical screening examination/treatment was performed by qualified clinical staff member and as supervising physician I was immediately available for consultation/collaboration. I have reviewed documentation and agree with assessment and plan.  Beatrice Lecher, MD

## 2020-01-08 ENCOUNTER — Ambulatory Visit (INDEPENDENT_AMBULATORY_CARE_PROVIDER_SITE_OTHER): Payer: Medicare HMO | Admitting: Nurse Practitioner

## 2020-01-08 ENCOUNTER — Encounter: Payer: Self-pay | Admitting: Nurse Practitioner

## 2020-01-08 VITALS — BP 130/81 | HR 63 | Temp 97.8°F | Ht 62.0 in | Wt 177.0 lb

## 2020-01-08 DIAGNOSIS — L309 Dermatitis, unspecified: Secondary | ICD-10-CM | POA: Diagnosis not present

## 2020-01-08 MED ORDER — TRIAMCINOLONE ACETONIDE 0.1 % EX CREA: 1.0000 "application " | TOPICAL_CREAM | Freq: Two times a day (BID) | CUTANEOUS | 3 refills | Status: DC

## 2020-01-08 MED ORDER — DOXYCYCLINE HYCLATE 100 MG PO TABS: 100.0000 mg | ORAL_TABLET | Freq: Two times a day (BID) | ORAL | 0 refills | Status: DC

## 2020-01-08 MED ORDER — PREDNISONE 20 MG PO TABS: 20.0000 mg | ORAL_TABLET | Freq: Two times a day (BID) | ORAL | 1 refills | Status: AC

## 2020-01-08 NOTE — Patient Instructions (Signed)
Rash, Adult A rash is a change in the color of your skin. A rash can also change the way your skin feels. There are many different conditions and factors that can cause a rash. Some rashes may disappear after a few days, but some may last for a few weeks. Common causes of rashes include:  Viral infections, such as: ? Colds. ? Measles. ? Hand, foot, and mouth disease.  Bacterial infections, such as: ? Scarlet fever. ? Impetigo.  Fungal infections, such as Candida.  Allergic reactions to food, medicines, or skin care products. Follow these instructions at home: The goal of treatment is to stop the itching and keep the rash from spreading. Pay attention to any changes in your symptoms. Follow these instructions to help with your condition: Medicine Take or apply over-the-counter and prescription medicines only as told by your health care provider. These may include:  Corticosteroid creams to treat red or swollen skin.  Anti-itch lotions.  Oral allergy medicines (antihistamines).  Oral corticosteroids for severe symptoms.  Skin care  Apply cool compresses to the affected areas.  Do not scratch or rub your skin.  Avoid covering the rash. Make sure the rash is exposed to air as much as possible. Managing itching and discomfort  Avoid hot showers or baths, which can make itching worse. A cold shower may help.  Try taking a bath with: ? Epsom salts. Follow manufacturer instructions on the packaging. You can get these at your local pharmacy or grocery store. ? Baking soda. Pour a small amount into the bath as told by your health care provider. ? Colloidal oatmeal. Follow manufacturer instructions on the packaging. You can get this at your local pharmacy or grocery store.  Try applying baking soda paste to your skin. Stir water into baking soda until it reaches a paste-like consistency.  Try applying calamine lotion. This is an over-the-counter lotion that helps to relieve  itchiness.  Keep cool and out of the sun. Sweating and being hot can make itching worse. General instructions   Rest as needed.  Drink enough fluid to keep your urine pale yellow.  Wear loose-fitting clothing.  Avoid scented soaps, detergents, and perfumes. Use gentle soaps, detergents, perfumes, and other cosmetic products.  Avoid any substance that causes your rash. Keep a journal to help track what causes your rash. Write down: ? What you eat. ? What cosmetic products you use. ? What you drink. ? What you wear. This includes jewelry.  Keep all follow-up visits as told by your health care provider. This is important. Contact a health care provider if:  You sweat at night.  You lose weight.  You urinate more than normal.  You urinate less than normal, or you notice that your urine is a darker color than usual.  You feel weak.  You vomit.  Your skin or the whites of your eyes look yellow (jaundice).  Your skin: ? Tingles. ? Is numb.  Your rash: ? Does not go away after several days. ? Gets worse.  You are: ? Unusually thirsty. ? More tired than normal.  You have: ? New symptoms. ? Pain in your abdomen. ? A fever. ? Diarrhea. Get help right away if you:  Have a fever and your symptoms suddenly get worse.  Develop confusion.  Have a severe headache or a stiff neck.  Have severe joint pains or stiffness.  Have a seizure.  Develop a rash that covers all or most of your body. The rash may   or may not be painful.  Develop blisters that: ? Are on top of the rash. ? Grow larger or grow together. ? Are painful. ? Are inside your nose or mouth.  Develop a rash that: ? Looks like purple pinprick-sized spots all over your body. ? Has a "bull's eye" or looks like a target. ? Is not related to sun exposure, is red and painful, and causes your skin to peel. Summary  A rash is a change in the color of your skin. Some rashes disappear after a few days,  but some may last for a few weeks.  The goal of treatment is to stop the itching and keep the rash from spreading.  Take or apply over-the-counter and prescription medicines only as told by your health care provider.  Contact a health care provider if you have new or worsening symptoms.  Keep all follow-up visits as told by your health care provider. This is important. This information is not intended to replace advice given to you by your health care provider. Make sure you discuss any questions you have with your health care provider. Document Revised: 11/10/2018 Document Reviewed: 02/20/2018 Elsevier Patient Education  2020 [employer]  

## 2020-01-08 NOTE — Progress Notes (Signed)
Acute Office Visit  Subjective:    Patient ID: Gina Norton, female    DOB: August 18, 1950, 69 y.o.   MRN: [id number]  Chief Complaint  Patient presents with  . Rash    HPI Patient is in today for erythematous, pruritic rash that first presented Saturday morning when she woke up on her right lateral upper arm. Since that time the rash has gradually spread under her right arm, across her chest, on the lateral portion of her left arm, on her chin, down the left side of her flank and abdomen, and into her groin.   She denies any new medication, soap, lotion, detergent, fabric softener, home cleaning products, food, or clothing. She is unable to think of anything that could have caused the rash. She did use an all natural soap last week, but she has used this in the past without issue and she reports the rash did not develop until several days later and the rash is growing despite no longer using the soap. She also reports she washed all over with the soap and the areas of the rash are splotchy.   She does report getting poison ivy annually, which feels very similar in nature, but she denies being outside or exposed at this time. She also denies any recent sun or heat exposure at the time of onset. She denies knowledge of any recent insect or tick bites.   She denies fever, chills, muscle aches, malaise, or fatigue.   She reports she feels "completely normal" with the exception of the intensely pruritic rash.   She has tried dabbing the area with a bleach solution, using triamcinolone cream, and peppermint oil with very little relief of the itching.   Past Medical History:  Diagnosis Date  . GERD (gastroesophageal reflux disease)     History reviewed. No pertinent surgical history.  Family History  Problem Relation Age of Onset  . Lung cancer Father 34       smoker  . Heart disease Mother        family history  . Diabetes Maternal Grandmother   . Hypertension Maternal Grandfather   .  Lung cancer Paternal Grandfather   . Thyroid cancer Son   . Leukemia Grandchild     Social History   Socioeconomic History  . Marital status: Married    Spouse name: Jesus   . Number of children: 10  . Years of education: 35  . Highest education level: 12th grade  Occupational History  . Occupation: Scientist, research (physical sciences): UNEMPLOYED  Tobacco Use  . Smoking status: Never Smoker  . Smokeless tobacco: Never Used  Substance and Sexual Activity  . Alcohol use: No  . Drug use: No  . Sexual activity: Yes    Partners: Male    Comment: husband has ED  Other Topics Concern  . Not on file  Social History Narrative    Drinks green tea during the day. No exercising since gym closed due to [place].   Social Determinants of Health   Financial Resource Strain:   . Difficulty of Paying Living Expenses:   Food Insecurity:   . Worried About Charity fundraiser in the Last Year:   . Arboriculturist in the Last Year:   Transportation Needs:   . Film/video editor (Medical):   Marland Kitchen Lack of Transportation (Non-Medical):   Physical Activity:   . Days of Exercise per Week:   . Minutes of Exercise per Session:  Stress:   . Feeling of Stress :   Social Connections:   . Frequency of Communication with Friends and Family:   . Frequency of Social Gatherings with Friends and Family:   . Attends Religious Services:   . Active Member of Clubs or Organizations:   . Attends Archivist Meetings:   Marland Kitchen Marital Status:   Intimate Partner Violence:   . Fear of Current or Ex-Partner:   . Emotionally Abused:   Marland Kitchen Physically Abused:   . Sexually Abused:     Outpatient Medications Prior to Visit  Medication Sig Dispense Refill  . AMBULATORY NON FORMULARY MEDICATION Medication Name: chia seeds    . AMBULATORY NON FORMULARY MEDICATION Medication Name: flax seeds    . AMBULATORY NON FORMULARY MEDICATION Holy Basil 500mg     . Ascorbic Acid (VITAMIN C) 1000 MG tablet Take 1,000 mg by mouth  daily.    . B Complex Vitamins (VITAMIN B COMPLEX PO) Take by mouth.    . Cholecalciferol (VITAMIN D3) 1000 units CAPS Take 4,000 Int'l Units by mouth.    . CHOLINE PO Take 550 mg by mouth.    . Coenzyme Q10 (CO Q 10) 100 MG CAPS Take by mouth.    . doxycycline (VIBRA-TABS) 100 MG tablet Take 1 tablet (100 mg total) by mouth 2 (two) times daily. (Patient not taking: Reported on 08/06/2019) 20 tablet 0  . Fluocinolone Acetonide 0.01 % OIL Place 1 application in ear(s) at bedtime as needed. (Patient not taking: Reported on 11/04/8183) 20 mL 0  . FOLIC ACID PO Take by mouth.    Javier Docker Oil 500 MG CAPS Take 500 mg by mouth.    . Lutein 20 MG CAPS Take by mouth 1 day or 1 dose.    . magnesium 30 MG tablet Take 30 mg by mouth 2 (two) times daily.    . Menaquinone-7 (VITAMIN K2) 100 MCG CAPS Take by mouth.    . Multiple Vitamins-Minerals (ZINC PO) Take by mouth.    . SPIRULINA PO Take 400 mg by mouth.    . vitamin E 400 UNIT capsule Take 400 Units by mouth daily.     No facility-administered medications prior to visit.    Allergies  Allergen Reactions  . Other Other (See Comments)    Pineapple,honeydew: causes sore throat and sores on tongue    Review of Systems  Constitutional: Negative for activity change, appetite change, chills, diaphoresis, fatigue and fever.  Eyes: Negative for pain, redness and itching.  Respiratory: Negative for cough, chest tightness, shortness of breath and wheezing.   Cardiovascular: Negative for chest pain, palpitations and leg swelling.  Gastrointestinal: Negative for abdominal pain, diarrhea, nausea and vomiting.  Musculoskeletal: Negative for arthralgias, joint swelling and myalgias.  Skin: Positive for color change and rash.  Neurological: Negative for dizziness, weakness, light-headedness, numbness and headaches.  Hematological: Negative for adenopathy.  Psychiatric/Behavioral: Positive for sleep disturbance.       Objective:    Physical Exam Vitals  and nursing note reviewed.  Constitutional:      Appearance: Normal appearance.  HENT:     Head: Normocephalic.     Nose: Nose normal.     Mouth/Throat:     Mouth: Mucous membranes are moist.     Pharynx: Oropharynx is clear.  Eyes:     Extraocular Movements: Extraocular movements intact.     Conjunctiva/sclera: Conjunctivae normal.     Pupils: Pupils are equal, round, and reactive to light.  Cardiovascular:  Rate and Rhythm: Normal rate and regular rhythm.     Pulses: Normal pulses.  Pulmonary:     Effort: Pulmonary effort is normal.     Breath sounds: Normal breath sounds.  Abdominal:     General: Abdomen is flat.     Palpations: Abdomen is soft.  Musculoskeletal:        General: Normal range of motion.     Cervical back: Normal range of motion.  Skin:    General: Skin is warm and dry.     Capillary Refill: Capillary refill takes less than 2 seconds.     Findings: Erythema and rash present.     Comments: Maculopapular rash with distinct borders on erythematous base located on the:  right lateral and posterior upper arm from shoulder to the antecubital fossa, wrapping around to the axilla; Left lateral and posterior upper arm from the shoulder to the antecubital fossa and wrapping around to the axilla; down the entire left flank; across the lower abdomen bilaterally, across the groin and down the left anterior thigh.  Scant pinpoint petechial areas scattered throughout. No reaction with Woods lamp  Neurological:     General: No focal deficit present.     Mental Status: She is alert and oriented to person, place, and time.  Psychiatric:        Mood and Affect: Mood normal.        Behavior: Behavior normal.        Thought Content: Thought content normal.        Judgment: Judgment normal.     Ht 5\' 2"  (1.575 m)   BMI 31.09 kg/m  Wt Readings from Last 3 Encounters:  08/06/19 170 lb (77.1 kg)  12/18/18 180 lb (81.6 kg)  07/04/18 172 lb (78 kg)    Health Maintenance  Due  Topic Date Due  . COVID-19 Vaccine (1) Never done  . PNA vac Low Risk Adult (1 of 2 - PCV13) Never done    There are no preventive care reminders to display for this patient.   Lab Results  Component Value Date   TSH 0.49 10/25/2017   Lab Results  Component Value Date   WBC 6.6 06/05/2018   HGB 15.4 06/05/2018   HCT 46.5 06/05/2018   MCV 86 06/05/2018   PLT 257 06/05/2018   Lab Results  Component Value Date   NA 142 06/05/2018   K 4.5 06/05/2018   CO2 23 06/05/2018   GLUCOSE 99 06/05/2018   BUN 7 (L) 06/05/2018   CREATININE 0.70 06/05/2018   BILITOT 0.4 06/05/2018   ALKPHOS 67 06/05/2018   AST 23 06/05/2018   ALT 20 06/05/2018   PROT 7.6 06/05/2018   ALBUMIN 4.9 (H) 06/05/2018   CALCIUM 10.1 06/05/2018   Lab Results  Component Value Date   CHOL 212 (H) 06/05/2018   Lab Results  Component Value Date   HDL 58 06/05/2018   Lab Results  Component Value Date   LDLCALC 131 (H) 06/05/2018   Lab Results  Component Value Date   TRIG 115 06/05/2018   Lab Results  Component Value Date   CHOLHDL 3.7 06/05/2018   Lab Results  Component Value Date   HGBA1C 5.7 (H) 10/25/2017       Assessment & Plan:   1. Dermatitis Dermatitis of unknown etiology in multiple large areas. Possibly an allergic reaction to an unknown substance she has come in contact with or possibly due to the soap that she started using last  week, but I feel this is less likely given the distribution and timing of the rash. Discussed avoiding any new foods, medications, or products while rash is present and to add new items individually once the rash has completely resolved. Patient may need further evaluation and testing if this returns or persists.   PLAN: -Treat with prednisone burst to reduce inflammatory process -Add doxycycline in the event this is precipitated by possible infectious process -Refill on triamcinolone cream- instructions to use very sparingly and no more than twice a  day for no more than 5 consecutive days.  -Follow-up if symptoms worsen, fail to improve, or return.  - doxycycline (VIBRA-TABS) 100 MG tablet; Take 1 tablet (100 mg total) by mouth 2 (two) times daily.  Dispense: 14 tablet; Refill: 0 - triamcinolone cream (KENALOG) 0.1 %; Apply 1 application topically 2 (two) times daily.  Dispense: 30 g; Refill: 3 - predniSONE (DELTASONE) 20 MG tablet; Take 1 tablet (20 mg total) by mouth 2 (two) times daily with a meal for 5 days. Take with breakfast and lunch to prevent interference with sleep.  Dispense: 10 tablet; Refill: 1   Orma Render, NP

## 2020-06-11 DIAGNOSIS — L821 Other seborrheic keratosis: Secondary | ICD-10-CM | POA: Diagnosis not present

## 2020-09-24 ENCOUNTER — Other Ambulatory Visit: Payer: Self-pay

## 2020-09-24 ENCOUNTER — Ambulatory Visit (INDEPENDENT_AMBULATORY_CARE_PROVIDER_SITE_OTHER): Payer: Medicare HMO | Admitting: Family Medicine

## 2020-09-24 VITALS — BP 107/58 | HR 72 | Temp 98.1°F | Resp 16 | Ht 62.0 in | Wt 164.0 lb

## 2020-09-24 DIAGNOSIS — Z13820 Encounter for screening for osteoporosis: Secondary | ICD-10-CM | POA: Diagnosis not present

## 2020-09-24 DIAGNOSIS — Z0000 Encounter for general adult medical examination without abnormal findings: Secondary | ICD-10-CM

## 2020-09-24 NOTE — Patient Instructions (Addendum)
Bone Density Test A bone density test uses a type of X-ray to measure the amount of calcium and other minerals in a person's bones. It can measure bone density in the hip and the spine. The test is similar to having a regular X-ray. This test may also be called:  Bone densitometry.  Bone mineral density test.  Dual-energy X-ray absorptiometry (DEXA). You may have this test to:  Diagnose a condition that causes weak or thin bones (osteoporosis).  Screen you for osteoporosis.  Predict your risk for a broken bone (fracture).  Determine how well your osteoporosis treatment is working. Tell a health care provider about:  Any allergies you have.  All medicines you are taking, including vitamins, herbs, eye drops, creams, and over-the-counter medicines.  Any problems you or family members have had with anesthetic medicines.  Any blood disorders you have.  Any surgeries you have had.  Any medical conditions you have.  Whether you are pregnant or may be pregnant.  Any medical tests you have had within the past 14 days that used contrast material. What are the risks? Generally, this is a safe test. However, it does expose you to a small amount of radiation, which can slightly increase your cancer risk. What happens before the test?  Do not take any calcium supplements within the 24 hours before your test.  You will need to remove all metal jewelry, eyeglasses, removable dental appliances, and any other metal objects on your body. What happens during the test?  You will lie down on an exam table. There will be an X-ray generator below you and an imaging device above you.  Other devices, such as boxes or braces, may be used to position your body properly for the scan.  The machine will slowly scan your body. You will need to keep very still while the machine does the scan.  The images will show up on a screen in the room. Images will be examined by a specialist after your  test is finished. The procedure may vary among health care providers and hospitals.   What can I expect after the test? It is up to you to get the results of your test. Ask your health care provider, or the department that is doing the test, when your results will be ready. Summary  A bone density test is an imaging test that uses a type of X-ray to measure the amount of calcium and other minerals in your bones.  The test may be used to diagnose or screen you for a condition that causes weak or thin bones (osteoporosis), predict your risk for a broken bone (fracture), or determine how well your osteoporosis treatment is working.  Do not take any calcium supplements within 24 hours before your test.  Ask your health care provider, or the department that is doing the test, when your results will be ready. This information is not intended to replace advice given to you by your health care provider. Make sure you discuss any questions you have with your health care provider. Document Revised: 01/03/2020 Document Reviewed: 01/03/2020 Elsevier Patient Education  2021 [employer].   Colonoscopy, Adult A colonoscopy is a procedure to look at the entire large intestine. This procedure is done using a long, thin, flexible tube that has a camera on the end. You may have a colonoscopy:  As a part of normal colorectal screening.  If you have certain symptoms, such as: ? A low number of red blood cells  in your blood (anemia). ? Diarrhea that does not go away. ? Pain in your abdomen. ? Blood in your stool. A colonoscopy can help screen for and diagnose medical problems, including:  Tumors.  Extra tissue that grows where mucus forms (polyps).  Inflammation.  Areas of bleeding. Tell your health care provider about:  Any allergies you have.  All medicines you are taking, including vitamins, herbs, eye drops, creams, and over-the-counter medicines.  Any problems you or family members have  had with anesthetic medicines.  Any blood disorders you have.  Any surgeries you have had.  Any medical conditions you have.  Any problems you have had with having bowel movements.  Whether you are pregnant or may be pregnant. What are the risks? Generally, this is a safe procedure. However, problems may occur, including:  Bleeding.  Damage to your intestine.  Allergic reactions to medicines given during the procedure.  Infection. This is rare. What happens before the procedure? Eating and drinking restrictions Follow instructions from your health care provider about eating or drinking restrictions, which may include:  A few days before the procedure: ? Follow a low-fiber diet. ? Avoid nuts, seeds, dried fruit, raw fruits, and vegetables.  1-3 days before the procedure: ? Eat only gelatin dessert or ice pops. ? Drink only clear liquids, such as water, clear juice, clear broth or bouillon, black coffee or tea, or clear soft drinks or sports drinks. ? Avoid liquids that contain red or purple dye.  The day of the procedure: ? Do not eat solid foods. You may continue to drink clear liquids until up to 2 hours before the procedure. ? Do not eat or drink anything starting 2 hours before the procedure, or within the time period that your health care provider recommends. Bowel prep If you were prescribed a bowel prep to take by mouth (orally) to clean out your colon:  Take it as told by your health care provider. Starting the day before your procedure, you will need to drink a large amount of liquid medicine. The liquid will cause you to have many bowel movements of loose stool until your stool becomes almost clear or light green.  If your skin or the opening between the buttocks (anus) gets irritated from diarrhea, you may relieve the irritation using: ? Wipes with medicine in them, such as adult wet wipes with aloe and vitamin E. ? A product to soothe skin, such as petroleum  jelly.  If you vomit while drinking the bowel prep: ? Take a break for up to 60 minutes. ? Begin the bowel prep again. ? Call your health care provider if you keep vomiting or you cannot take the bowel prep without vomiting.  To clean out your colon, you may also be given: ? Laxative medicines. These help you have a bowel movement. ? Instructions for enema use. An enema is liquid medicine injected into your rectum. Medicines Ask your health care provider about:  Changing or stopping your regular medicines or supplements. This is especially important if you are taking iron supplements, diabetes medicines, or blood thinners.  Taking medicines such as aspirin and ibuprofen. These medicines can thin your blood. Do not take these medicines unless your health care provider tells you to take them.  Taking over-the-counter medicines, vitamins, herbs, and supplements. General instructions  Ask your health care provider what steps will be taken to help prevent infection. These may include washing skin with a germ-killing soap.  Plan to have someone take  you home from the hospital or clinic. What happens during the procedure?  An IV will be inserted into one of your veins.  You may be given one or more of the following: ? A medicine to help you relax (sedative). ? A medicine to numb the area (local anesthetic). ? A medicine to make you fall asleep (general anesthetic). This is rarely needed.  You will lie on your side with your knees bent.  The tube will: ? Have oil or gel put on it (be lubricated). ? Be inserted into your anus. ? Be gently eased through all parts of your large intestine.  Air will be sent into your colon to keep it open. This may cause some pressure or cramping.  Images will be taken with the camera and will appear on a screen.  A small tissue sample may be removed to be looked at under a microscope (biopsy). The tissue may be sent to a lab for testing if any signs  of problems are found.  If small polyps are found, they may be removed and checked for cancer cells.  When the procedure is finished, the tube will be removed. The procedure may vary among health care providers and hospitals.   What happens after the procedure?  Your blood pressure, heart rate, breathing rate, and blood oxygen level will be monitored until you leave the hospital or clinic.  You may have a small amount of blood in your stool.  You may pass gas and have mild cramping or bloating in your abdomen. This is caused by the air that was used to open your colon during the exam.  Do not drive for 24 hours after the procedure.  It is up to you to get the results of your procedure. Ask your health care provider, or the department that is doing the procedure, when your results will be ready. Summary  A colonoscopy is a procedure to look at the entire large intestine.  Follow instructions from your health care provider about eating and drinking before the procedure.  If you were prescribed an oral bowel prep to clean out your colon, take it as told by your health care provider.  During the colonoscopy, a flexible tube with a camera on its end is inserted into the anus and then passed into the other parts of the large intestine. This information is not intended to replace advice given to you by your health care provider. Make sure you discuss any questions you have with your health care provider. Document Revised: 02/09/2019 Document Reviewed: 02/09/2019 Elsevier Patient Education  [place] Maintenance, Female Adopting a healthy lifestyle and getting preventive care are important in promoting health and wellness. Ask your health care provider about:  The right schedule for you to have regular tests and exams.  Things you can do on your own to prevent diseases and keep yourself healthy. What should I know about diet, weight, and exercise? Eat a healthy  diet  Eat a diet that includes plenty of vegetables, fruits, low-fat dairy products, and lean protein.  Do not eat a lot of foods that are high in solid fats, added sugars, or sodium.   Maintain a healthy weight Body mass index (BMI) is used to identify weight problems. It estimates body fat based on height and weight. Your health care provider can help determine your BMI and help you achieve or maintain a healthy weight. Get regular exercise Get regular exercise. This is one of  the most important things you can do for your health. Most adults should:  Exercise for at least 150 minutes each week. The exercise should increase your heart rate and make you sweat (moderate-intensity exercise).  Do strengthening exercises at least twice a week. This is in addition to the moderate-intensity exercise.  Spend less time sitting. Even light physical activity can be beneficial. Watch cholesterol and blood lipids Have your blood tested for lipids and cholesterol at 70 years of age, then have this test every 5 years. Have your cholesterol levels checked more often if:  Your lipid or cholesterol levels are high.  You are older than 70 years of age.  You are at high risk for heart disease. What should I know about cancer screening? Depending on your health history and family history, you may need to have cancer screening at various ages. This may include screening for:  Breast cancer.  Cervical cancer.  Colorectal cancer.  Skin cancer.  Lung cancer. What should I know about heart disease, diabetes, and high blood pressure? Blood pressure and heart disease  High blood pressure causes heart disease and increases the risk of stroke. This is more likely to develop in people who have high blood pressure readings, are of African descent, or are overweight.  Have your blood pressure checked: ? Every 3-5 years if you are 29-95 years of age. ? Every year if you are 22 years old or  older. Diabetes Have regular diabetes screenings. This checks your fasting blood sugar level. Have the screening done:  Once every three years after age 35 if you are at a normal weight and have a low risk for diabetes.  More often and at a younger age if you are overweight or have a high risk for diabetes. What should I know about preventing infection? Hepatitis B If you have a higher risk for hepatitis B, you should be screened for this virus. Talk with your health care provider to find out if you are at risk for hepatitis B infection. Hepatitis C Testing is recommended for:  Everyone born from [birth year] through 1965.  Anyone with known risk factors for hepatitis C. Sexually transmitted infections (STIs)  Get screened for STIs, including gonorrhea and chlamydia, if: ? You are sexually active and are younger than 70 years of age. ? You are older than 70 years of age and your health care provider tells you that you are at risk for this type of infection. ? Your sexual activity has changed since you were last screened, and you are at increased risk for chlamydia or gonorrhea. Ask your health care provider if you are at risk.  Ask your health care provider about whether you are at high risk for HIV. Your health care provider may recommend a prescription medicine to help prevent HIV infection. If you choose to take medicine to prevent HIV, you should first get tested for HIV. You should then be tested every 3 months for as long as you are taking the medicine. Pregnancy  If you are about to stop having your period (premenopausal) and you may become pregnant, seek counseling before you get pregnant.  Take 400 to 800 micrograms (mcg) of folic acid every day if you become pregnant.  Ask for birth control (contraception) if you want to prevent pregnancy. Osteoporosis and menopause Osteoporosis is a disease in which the bones lose minerals and strength with aging. This can result in bone fractures.  If you are 24 years old or older, or  if you are at risk for osteoporosis and fractures, ask your health care provider if you should:  Be screened for bone loss.  Take a calcium or vitamin D supplement to lower your risk of fractures.  Be given hormone replacement therapy (HRT) to treat symptoms of menopause. Follow these instructions at home: Lifestyle  Do not use any products that contain nicotine or tobacco, such as cigarettes, e-cigarettes, and chewing tobacco. If you need help quitting, ask your health care provider.  Do not use street drugs.  Do not share needles.  Ask your health care provider for help if you need support or information about quitting drugs. Alcohol use  Do not drink alcohol if: ? Your health care provider tells you not to drink. ? You are pregnant, may be pregnant, or are planning to become pregnant.  If you drink alcohol: ? Limit how much you use to 0-1 drink a day. ? Limit intake if you are breastfeeding.  Be aware of how much alcohol is in your drink. In the [place] one drink equals one 12 oz bottle of beer (355 mL), one 5 oz glass of wine (148 mL), or one 1 oz glass of hard liquor (44 mL). General instructions  Schedule regular health, dental, and eye exams.  Stay current with your vaccines.  Tell your health care provider if: ? You often feel depressed. ? You have ever been abused or do not feel safe at home. Summary  Adopting a healthy lifestyle and getting preventive care are important in promoting health and wellness.  Follow your health care provider's instructions about healthy diet, exercising, and getting tested or screened for diseases.  Follow your health care provider's instructions on monitoring your cholesterol and blood pressure. This information is not intended to replace advice given to you by your health care provider. Make sure you discuss any questions you have with your health care provider. Document Revised: 07/12/2018  Document Reviewed: 07/12/2018 Elsevier Patient Education  2021 [place] A mammogram is a low energy X-ray of the breasts that is done to check for abnormal changes. This procedure can screen for and detect any changes that may indicate breast cancer. Mammograms are regularly done on women. A man may have a mammogram if he has a lump or swelling in his breast. A mammogram can also identify other changes and variations in the breast, such as:  Inflammation of the breast tissue (mastitis).  An infected area that contains a collection of pus (abscess).  A fluid-filled sac (cyst).  Fibrocystic changes. This is when breast tissue becomes denser, which can make the tissue feel rope-like or uneven under the skin.  Tumors that are not cancerous (benign). Tell a health care provider:  About any allergies you have.  If you have breast implants.  If you have had previous breast disease, biopsy, or surgery.  If you are breastfeeding.  If you are younger than age 45.  If you have a family history of breast cancer.  Whether you are pregnant or may be pregnant. What are the risks? Generally, this is a safe procedure. However, problems may occur, including:  Exposure to radiation. Radiation levels are very low with this test.  The results being misinterpreted.  The need for further tests.  The inability of the mammogram to detect certain cancers. What happens before the procedure?  Schedule your test about 1-2 weeks after your menstrual period if you are still menstruating. This is usually when your breasts are  the least tender.  If you have had a mammogram done at a different facility in the past, get the mammogram X-rays or have them sent to your current exam facility. The new and old images will be compared.  Wash your breasts and underarms on the day of the test.  Do not wear deodorants, perfumes, lotions, or powders anywhere on your body on the day of the  test.  Remove any jewelry from your neck.  Wear clothes that you can change into and out of easily. What happens during the procedure?  You will undress from the waist up and put on a gown that opens in the front.  You will stand in front of the X-ray machine.  Each breast will be placed between two plastic or glass plates. The plates will compress your breast for a few seconds. Try to stay as relaxed as possible during the procedure. This does not cause any harm to your breasts and any discomfort you feel will be very brief.  X-rays will be taken from different angles of each breast. The procedure may vary among health care providers and hospitals.   What happens after the procedure?  The mammogram will be examined by a specialist (radiologist).  You may need to repeat certain parts of the test, depending on the quality of the images. This is commonly done if the radiologist needs a better view of the breast tissue.  You may resume your normal activities.  It is up to you to get the results of your procedure. Ask your health care provider, or the department that is doing the procedure, when your results will be ready. Summary  A mammogram is a low energy X-ray of the breasts that is done to check for abnormal changes. A man may have a mammogram if he has a lump or swelling in his breast.  If you have had a mammogram done at a different facility in the past, get the mammogram X-rays or have them sent to your current exam facility in order to compare them.  Schedule your test about 1-2 weeks after your menstrual period if you are still menstruating.  For this test, each breast will be placed between two plastic or glass plates. The plates will compress your breast for a few seconds.  Ask when your test results will be ready. Make sure you get your test results. This information is not intended to replace advice given to you by your health care provider. Make sure you discuss any  questions you have with your health care provider. Document Revised: 03/09/2018 Document Reviewed: 03/09/2018 Elsevier Patient Education  2021 [place] Maintenance Summary and Written Plan of Care  Ms. Battey ,  Thank you for allowing me to perform your Medicare Annual Wellness Visit and for your ongoing commitment to your health.   Health Maintenance & Immunization History Health Maintenance  Topic Date Due  . COVID-19 Vaccine (1) Never done  . COLONOSCOPY (Pts 45-17yrs Insurance coverage will need to be confirmed)  Never done  . MAMMOGRAM  06/10/2012  . PNA vac Low Risk Adult (1 of 2 - PCV13) Never done  . INFLUENZA VACCINE  03/02/2020  . TETANUS/TDAP  02/11/2021  . DEXA SCAN  Completed  . Hepatitis C Screening  Completed    There is no immunization history on file for this patient.  These are the patient goals that we discussed: Goals Addressed  This Visit's Progress   .  Patient Stated (pt-stated)        09/24/2020 AWV Goal: Exercise for General Health   Patient will verbalize understanding of the benefits of increased physical activity:  Exercising regularly is important. It will improve your overall fitness, flexibility, and endurance.  Regular exercise also will improve your overall health. It can help you control your weight, reduce stress, and improve your bone density.  Over the next year, patient will increase physical activity as tolerated with a goal of at least 150 minutes of moderate physical activity per week.   You can tell that you are exercising at a moderate intensity if your heart starts beating faster and you start breathing faster but can still hold a conversation.  Moderate-intensity exercise ideas include:  Walking 1 mile (1.6 km) in about 15 minutes  Biking  Hiking  Golfing  Dancing  Water aerobics  Patient will verbalize understanding of everyday activities that increase  physical activity by providing examples like the following: ? Yard work, such as: ? Pushing a Conservation officer, nature ? Raking and bagging leaves ? Washing your car ? Pushing a stroller ? Shoveling snow ? Gardening ? Washing windows or floors  Patient will be able to explain general safety guidelines for exercising:   Before you start a new exercise program, talk with your health care provider.  Do not exercise so much that you hurt yourself, feel dizzy, or get very short of breath.  Wear comfortable clothes and wear shoes with good support.  Drink plenty of water while you exercise to prevent dehydration or heat stroke.  Work out until your breathing and your heartbeat get faster.         This is a list of Health Maintenance Items that are overdue or due now: Health Maintenance Due  Topic Date Due  . COVID-19 Vaccine (1) Never done  . COLONOSCOPY (Pts 45-64yrs Insurance coverage will need to be confirmed)  Never done  . MAMMOGRAM  06/10/2012  . PNA vac Low Risk Adult (1 of 2 - PCV13) Never done  . INFLUENZA VACCINE  03/02/2020   Pneumococcal vaccine  Influenza vaccine Td vaccine Screening mammography Bone densitometry screening Colorectal cancer screening  Shingles vaccine Covid vaccine  Orders/Referrals Placed Today: Orders Placed This Encounter  Procedures  . DEXAScan    Standing Status:   Future    Standing Expiration Date:   09/24/2021    Scheduling Instructions:     Please call the patient to schedule    Order Specific Question:   Reason for exam:    Answer:   screening for osteoporosis    Order Specific Question:   Preferred imaging location?    Answer:   [facility]    Follow-up Plan . Follow-up with Hali Marry, MD as planned . Schedule your next medicare wellness exam in one year.

## 2020-09-24 NOTE — Progress Notes (Addendum)
MEDICARE ANNUAL WELLNESS VISIT  09/24/2020   Subjective:  Gina Norton is a 70 y.o. female patient of Metheney, Rene Kocher, MD who had a Medicare Annual Wellness Visit today. Gina Norton is Retired and lives with their family. she has 10 children. she reports that she is socially active and does interact with friends/family regularly. she is minimally physically active and enjoys puzzles, reading and watching tv.  Patient Care Team: Hali Marry, MD as PCP - General (Family Medicine)  Advanced Directives 09/24/2020 08/06/2019 07/04/2018 06/12/2018  Does Patient Have a Medical Advance Directive? No No No No  Would patient like information on creating a medical advance directive? No - Patient declined No - Patient declined Yes (MAU/Ambulatory/Procedural Areas - Information given) No - Patient declined    Hospital Utilization Over the Past 12 Months: # of hospitalizations or ER visits: 0 # of surgeries: 0  Review of Systems    Patient reports that her overall health is unchanged when compared to last year.  Review of Systems: History obtained from chart review and the patient  All other systems negative.  Pain Assessment Pain : No/denies pain     Current Medications & Allergies (verified) Allergies as of 09/24/2020      Reactions   Other Other (See Comments)   Pineapple,honeydew: causes sore throat and sores on tongue      Medication List       Accurate as of September 24, 2020  1:56 PM. If you have any questions, ask your nurse or doctor.        AMBULATORY NON FORMULARY MEDICATION Medication Name: flax seeds   AMBULATORY NON [place] 500mg    AMBULATORY NON FORMULARY MEDICATION Medication Name: chia seeds   CHOLINE PO Take 550 mg by mouth.   Chromium 1 MG Caps Take by mouth. Once a day   Co Q 10 100 MG Caps Take by mouth.   doxycycline 100 MG tablet Commonly known as: VIBRA-TABS Take 1 tablet (100 mg total) by mouth 2 (two) times  daily.   FOLIC ACID PO Take by mouth.   Garlic 10 MG Caps Take by mouth. Once a day   Krill Oil 500 MG Caps Take 500 mg by mouth.   Lutein 20 MG Caps Take by mouth 1 day or 1 dose.   magnesium 30 MG tablet Take 30 mg by mouth 2 (two) times daily.   SPIRULINA PO Take 400 mg by mouth.   triamcinolone 0.1 % Commonly known as: KENALOG Apply 1 application topically 2 (two) times daily.   TURMERIC-GINGER PO Take 1 tablet by mouth daily.   VITAMIN B COMPLEX PO Take by mouth.   vitamin C 1000 MG tablet Take 1,000 mg by mouth daily.   Vitamin D3 25 MCG (1000 UT) Caps Take 4,000 Int'l Units by mouth.   vitamin E 180 MG (400 UNITS) capsule Take 400 Units by mouth daily.   Vitamin K2 100 MCG Caps Take by mouth.   ZINC PO Take by mouth.   ZINC PO Take 1 tablet by mouth daily.       History (reviewed): Past Medical History:  Diagnosis Date  . GERD (gastroesophageal reflux disease)    History reviewed. No pertinent surgical history. Family History  Problem Relation Age of Onset  . Lung cancer Father 64       smoker  . Heart disease Mother        family history  . Diabetes Maternal Grandmother   .  Hypertension Maternal Grandfather   . Lung cancer Paternal Grandfather   . Thyroid cancer Son   . Leukemia Grandchild    Social History   Socioeconomic History  . Marital status: Married    Spouse name: Jesus   . Number of children: 10  . Years of education: 49  . Highest education level: 12th grade  Occupational History  . Occupation: Scientist, research (physical sciences): UNEMPLOYED  Tobacco Use  . Smoking status: Never Smoker  . Smokeless tobacco: Never Used  Vaping Use  . Vaping Use: Never used  Substance and Sexual Activity  . Alcohol use: No  . Drug use: No  . Sexual activity: Yes    Partners: Male    Comment: husband has ED  Other Topics Concern  . Not on file  Social History Narrative    Drinks green tea during the day. No exercising since gym closed due  to [place].   Social Determinants of Health   Financial Resource Strain: Low Risk   . Difficulty of Paying Living Expenses: Not hard at all  Food Insecurity: No Food Insecurity  . Worried About Charity fundraiser in the Last Year: Never true  . Ran Out of Food in the Last Year: Never true  Transportation Needs: No Transportation Needs  . Lack of Transportation (Medical): No  . Lack of Transportation (Non-Medical): No  Physical Activity: Inactive  . Days of Exercise per Week: 0 days  . Minutes of Exercise per Session: 0 min  Stress: No Stress Concern Present  . Feeling of Stress : Not at all  Social Connections: Moderately Integrated  . Frequency of Communication with Friends and Family: More than three times a week  . Frequency of Social Gatherings with Friends and Family: More than three times a week  . Attends Religious Services: More than 4 times per year  . Active Member of Clubs or Organizations: No  . Attends Archivist Meetings: Never  . Marital Status: Married    Activities of Daily Living In your present state of health, do you have any difficulty performing the following activities: 09/24/2020  Hearing? N  Vision? N  Difficulty concentrating or making decisions? N  Walking or climbing stairs? N  Dressing or bathing? N  Doing errands, shopping? N  Preparing Food and eating ? N  Using the Toilet? N  In the past six months, have you accidently leaked urine? N  Do you have problems with loss of bowel control? N  Managing your Medications? N  Managing your Finances? N  Housekeeping or managing your Housekeeping? N  Some recent data might be hidden    Patient Education/Literacy How often do you need to have someone help you when you read instructions, pamphlets, or other written materials from your doctor or pharmacy?: 1 - Never What is the last grade level you completed in school?: 12th grade  Exercise Current Exercise Habits: The patient does not  participate in regular exercise at present, Exercise limited by: None identified  Diet Patient reports consuming 2 meals a day and 1 snack(s) a day Patient reports that her primary diet is: Regular Patient reports that she does have regular access to food.   Depression Screen PHQ 2/9 Scores 09/24/2020 08/06/2019 07/04/2018 06/01/2018 10/25/2017  PHQ - 2 Score 0 0 0 0 0  PHQ- 9 Score 0 - 0 1 -     Fall Risk Fall Risk  09/24/2020 08/06/2019 07/04/2018 10/25/2017  Falls in the past  year? 0 0 0 No  Number falls in past yr: 0 0 - -  Injury with Fall? 0 0 - -  Risk for fall due to : No Fall Risks No Fall Risks - -  Follow up Falls evaluation completed Falls prevention discussed Falls prevention discussed -     Objective:   BP (!) 107/58 (BP Location: Right Arm, Patient Position: Sitting, Cuff Size: Normal)   Pulse 72   Temp 98.1 F (36.7 C) (Oral)   Resp 16   Ht 5\' 2"  (1.575 m)   Wt 164 lb (74.4 kg)   SpO2 100%   BMI 30.00 kg/m   Last Weight  Most recent update: 09/24/2020  1:09 PM   Weight  74.4 kg (164 lb)            Body mass index is 30 kg/m.  Hearing/Vision  . Gina Norton did not have difficulty with hearing/understanding during the face-to-face interview . Gina Norton did not have difficulty with her vision during the face-to-face interview . Reports that she has not had a formal eye exam by an eye care professional within the past year . Reports that she has not had a formal hearing evaluation within the past year  Cognitive Function: 6CIT Screen 09/24/2020 08/06/2019 07/04/2018  What Year? 0 points 0 points 0 points  What month? 0 points 0 points 0 points  What time? 0 points 0 points 0 points  Count back from 20 0 points 0 points 0 points  Months in reverse 0 points 0 points 0 points  Repeat phrase 0 points 0 points 0 points  Total Score 0 0 0    Normal Cognitive Function Screening: Yes (Normal:0-7, Significant for Dysfunction: >8)  Immunization & Health Maintenance  Record  There is no immunization history on file for this patient.  Health Maintenance  Topic Date Due  . COVID-19 Vaccine (1) Never done  . COLONOSCOPY (Pts 45-30yrs Insurance coverage will need to be confirmed)  Never done  . MAMMOGRAM  06/10/2012  . PNA vac Low Risk Adult (1 of 2 - PCV13) Never done  . INFLUENZA VACCINE  03/02/2020  . TETANUS/TDAP  02/11/2021  . DEXA SCAN  Completed  . Hepatitis C Screening  Completed       Assessment  This is a routine wellness examination for Gina Norton.  Health Maintenance: Due or Overdue Health Maintenance Due  Topic Date Due  . COVID-19 Vaccine (1) Never done  . COLONOSCOPY (Pts 45-36yrs Insurance coverage will need to be confirmed)  Never done  . MAMMOGRAM  06/10/2012  . PNA vac Low Risk Adult (1 of 2 - PCV13) Never done  . INFLUENZA VACCINE  03/02/2020    Gina Norton does not need a referral for Community Assistance: Care Management:   no Social Work:    no Prescription Assistance:  no Nutrition/Diabetes Education:  no   Plan:  Personalized Goals Goals Addressed              This Visit's Progress   .  Patient Stated (pt-stated)        09/24/2020 AWV Goal: Exercise for General Health   Patient will verbalize understanding of the benefits of increased physical activity:  Exercising regularly is important. It will improve your overall fitness, flexibility, and endurance.  Regular exercise also will improve your overall health. It can help you control your weight, reduce stress, and improve your bone density.  Over the next year, patient will increase physical activity  as tolerated with a goal of at least 150 minutes of moderate physical activity per week.   You can tell that you are exercising at a moderate intensity if your heart starts beating faster and you start breathing faster but can still hold a conversation.  Moderate-intensity exercise ideas include:  Walking 1 mile (1.6 km) in about 15  minutes  Biking  Hiking  Golfing  Dancing  Water aerobics  Patient will verbalize understanding of everyday activities that increase physical activity by providing examples like the following: ? Yard work, such as: ? Pushing a Conservation officer, nature ? Raking and bagging leaves ? Washing your car ? Pushing a stroller ? Shoveling snow ? Gardening ? Washing windows or floors  Patient will be able to explain general safety guidelines for exercising:   Before you start a new exercise program, talk with your health care provider.  Do not exercise so much that you hurt yourself, feel dizzy, or get very short of breath.  Wear comfortable clothes and wear shoes with good support.  Drink plenty of water while you exercise to prevent dehydration or heat stroke.  Work out until your breathing and your heartbeat get faster.       Personalized Health Maintenance & Screening Recommendations  Pneumococcal vaccine  Influenza vaccine Td vaccine Screening mammography Bone densitometry screening Colorectal cancer screening  Shingles vaccine Covid vaccine  Lung Cancer Screening Recommended: no (Low Dose CT Chest recommended if Age 29-80 years, 30 pack-year currently smoking OR have quit w/in past 15 years) Hepatitis C Screening recommended: no HIV Screening recommended: no  Advanced Directives: Written information was not given per the patient's request.  Referrals & Orders Dexa scan referral  Follow-up Plan . Follow-up with Hali Marry, MD as planned . Schedule your next medicare wellness exam in one year.   I have personally reviewed and noted the following in the patient's chart:   . Medical and social history . Use of alcohol, tobacco or illicit drugs  . Current medications and supplements . Functional ability and status . Nutritional status . Physical activity . Advanced directives . List of other physicians . Hospitalizations, surgeries, and ER visits in previous  12 months . Vitals . Screenings to include cognitive, depression, and falls . Referrals and appointments  In addition, I have reviewed and discussed with patient certain preventive protocols, quality metrics, and best practice recommendations. A written personalized care plan for preventive services as well as general preventive health recommendations were provided to patient.     Gina Gens, RN  09/24/2020

## 2020-12-03 ENCOUNTER — Other Ambulatory Visit: Payer: Self-pay

## 2020-12-03 ENCOUNTER — Ambulatory Visit (INDEPENDENT_AMBULATORY_CARE_PROVIDER_SITE_OTHER): Payer: Medicare HMO

## 2020-12-03 DIAGNOSIS — M810 Age-related osteoporosis without current pathological fracture: Secondary | ICD-10-CM

## 2020-12-03 DIAGNOSIS — Z780 Asymptomatic menopausal state: Secondary | ICD-10-CM | POA: Diagnosis not present

## 2020-12-03 DIAGNOSIS — Z13820 Encounter for screening for osteoporosis: Secondary | ICD-10-CM | POA: Diagnosis not present

## 2020-12-03 DIAGNOSIS — Z0000 Encounter for general adult medical examination without abnormal findings: Secondary | ICD-10-CM

## 2020-12-05 ENCOUNTER — Encounter: Payer: Self-pay | Admitting: Family Medicine

## 2020-12-05 DIAGNOSIS — M810 Age-related osteoporosis without current pathological fracture: Secondary | ICD-10-CM | POA: Insufficient documentation

## 2021-01-26 ENCOUNTER — Ambulatory Visit (INDEPENDENT_AMBULATORY_CARE_PROVIDER_SITE_OTHER): Payer: Medicare HMO | Admitting: Physician Assistant

## 2021-01-26 ENCOUNTER — Encounter: Payer: Self-pay | Admitting: Physician Assistant

## 2021-01-26 ENCOUNTER — Other Ambulatory Visit: Payer: Self-pay

## 2021-01-26 VITALS — BP 105/1 | HR 62

## 2021-01-26 DIAGNOSIS — L255 Unspecified contact dermatitis due to plants, except food: Secondary | ICD-10-CM | POA: Diagnosis not present

## 2021-01-26 DIAGNOSIS — L304 Erythema intertrigo: Secondary | ICD-10-CM | POA: Insufficient documentation

## 2021-01-26 MED ORDER — NYSTATIN 100000 UNIT/GM EX CREA: 1.0000 "application " | TOPICAL_CREAM | Freq: Two times a day (BID) | CUTANEOUS | 3 refills | Status: DC

## 2021-01-26 MED ORDER — TRIAMCINOLONE ACETONIDE 0.1 % EX CREA
1.0000 | TOPICAL_CREAM | Freq: Two times a day (BID) | CUTANEOUS | 3 refills | Status: DC
Start: 2021-01-26 — End: 2023-10-03

## 2021-01-26 MED ORDER — NYSTATIN 100000 UNIT/GM EX POWD: 1.0000 "application " | Freq: Three times a day (TID) | CUTANEOUS | 3 refills | Status: DC

## 2021-01-26 NOTE — Patient Instructions (Signed)
Intertrigo Intertrigo is skin irritation or inflammation (dermatitis) that occurs when folds of skin rub together. The irritation can cause a rash and make skin raw and itchy. This condition most commonly occurs in the skin folds of these areas: Toes. Armpits. Groin. Under the belly. Under the breasts. Buttocks. Intertrigo is not passed from person to person (is not contagious). What are the causes? This condition is caused by heat, moisture, rubbing (friction), and not enough air circulation. The condition can be made worse by: Sweat. Bacteria. A fungus, such as yeast. What increases the risk? This condition is more likely to occur if you have moisture in your skin folds. You are more likely to develop this condition if you: Have diabetes. Are overweight. Are not able to move around or are not active. Live in a warm and moist climate. Wear splints, braces, or other medical devices. Are not able to control your bowels or bladder (have incontinence). What are the signs or symptoms? Symptoms of this condition include: A pink or red skin rash in the skin fold or near the skin fold. Raw or scaly skin. Itchiness. A burning feeling. Bleeding. Leaking fluid. A bad smell. How is this diagnosed? This condition is diagnosed with a medical history and physical exam. You mayalso have a skin swab to test for bacteria or a fungus. How is this treated? This condition may be treated by: Cleaning and drying your skin. Taking an antibiotic medicine or using an antibiotic skin cream for a bacterial infection. Using an antifungal cream on your skin or taking pills for an infection that was caused by a fungus, such as yeast. Using a steroid ointment to relieve itchiness and irritation. Separating the skin fold with a clean cotton cloth to absorb moisture and allow air to flow into the area. Follow these instructions at home: Keep the affected area clean and dry. Do not scratch your skin. Stay  in a cool environment as much as possible. Use an air conditioner or fan, if available. Apply over-the-counter and prescription medicines only as told by your health care provider. If you were prescribed an antibiotic medicine, use it as told by your health care provider. Do not stop using the antibiotic even if your condition improves. Keep all follow-up visits as told by your health care provider. This is important. How is this prevented?  Maintain a healthy weight. Take care of your feet, especially if you have diabetes. Foot care includes: Wearing shoes that fit well. Keeping your feet dry. Wearing clean, breathable socks. Protect the skin around your groin and buttocks, especially if you have incontinence. Skin protection includes: Following a regular cleaning routine. Using skin protectant creams, powders, or ointments. Changing protection pads frequently. Do not wear tight clothes. Wear clothes that are loose, absorbent, and made of cotton. Wear a bra that gives good support, if needed. Shower and dry yourself well after activity or exercise. Use a hair dryer on a cool setting to dry between skin folds, especially after you bathe. If you have diabetes, keep your blood sugar under control. Contact a health care provider if: Your symptoms do not improve with treatment. Your symptoms get worse or they spread. You notice increased redness and warmth. You have a fever. Summary Intertrigo is skin irritation or inflammation (dermatitis) that occurs when folds of skin rub together. This condition is caused by heat, moisture, rubbing (friction), and not enough air circulation. This condition may be treated by cleaning and drying your skin and with medicines. Apply  over-the-counter and prescription medicines only as told by your health care provider. Keep all follow-up visits as told by your health care provider. This is important. This information is not intended to replace advice given  to you by your health care provider. Make sure you discuss any questions you have with your healthcare provider. Document Revised: 12/12/2017 Document Reviewed: 12/19/2017 Elsevier Patient Education  [place].

## 2021-01-26 NOTE — Progress Notes (Signed)
Acute Office Visit  Subjective:    Patient ID: Gina Norton, female    DOB: 08-07-1950, 70 y.o.   MRN: [id number]  No chief complaint on file.   HPI Patient is in today for rash  New onset rash of left knee x 2-3 days Has spread locally to other area of anterior knee It started asymptomatic and now pruritic Denies pain, paresthesias, streaking redness, drainage Denies constitutional symptoms Denies any outdoor/occupational exposures No prior hx of Shingles No treatments tried  Has prescriptions from Derm for Betamethasone and Triamcinolone Uses on chronic inguinal and inframammary rashes Requesting refill of Triamcinolone   Past Medical History:  Diagnosis Date   GERD (gastroesophageal reflux disease)     History reviewed. No pertinent surgical history.  Family History  Problem Relation Age of Onset   Lung cancer Father 24       smoker   Heart disease Mother        family history   Diabetes Maternal Grandmother    Hypertension Maternal Grandfather    Lung cancer Paternal Grandfather    Thyroid cancer Son    Leukemia Grandchild     Social History   Socioeconomic History   Marital status: Married    Spouse name: Jesus    Number of children: 10   Years of education: 12   Highest education level: 12th grade  Occupational History   Occupation: Scientist, research (physical sciences): UNEMPLOYED  Tobacco Use   Smoking status: Never   Smokeless tobacco: Never  Vaping Use   Vaping Use: Never used  Substance and Sexual Activity   Alcohol use: No   Drug use: No   Sexual activity: Yes    Partners: Male    Comment: husband has ED  Other Topics Concern   Not on file  Social History Narrative    Drinks green tea during the day. No exercising since gym closed due to [place].   Social Determinants of Health   Financial Resource Strain: Low Risk    Difficulty of Paying Living Expenses: Not hard at all  Food Insecurity: No Food Insecurity   Worried About Charity fundraiser  in the Last Year: Never true   [place] in the Last Year: Never true  Transportation Needs: No Transportation Needs   Lack of Transportation (Medical): No   Lack of Transportation (Non-Medical): No  Physical Activity: Inactive   Days of Exercise per Week: 0 days   Minutes of Exercise per Session: 0 min  Stress: No Stress Concern Present   Feeling of Stress : Not at all  Social Connections: Moderately Integrated   Frequency of Communication with Friends and Family: More than three times a week   Frequency of Social Gatherings with Friends and Family: More than three times a week   Attends Religious Services: More than 4 times per year   Active Member of [employer] or Organizations: No   Attends Music therapist: Never   Marital Status: Married  Human resources officer Violence: Not At Risk   Fear of Current or Ex-Partner: No   Emotionally Abused: No   Physically Abused: No   Sexually Abused: No    Outpatient Medications Prior to Visit  Medication Sig Dispense Refill   AMBULATORY NON FORMULARY MEDICATION Medication Name: chia seeds     AMBULATORY NON FORMULARY MEDICATION Medication Name: flax seeds     AMBULATORY NON FORMULARY MEDICATION [place] 500mg      Ascorbic Acid (VITAMIN  C) 1000 MG tablet Take 1,000 mg by mouth daily.     B Complex Vitamins (VITAMIN B COMPLEX PO) Take by mouth.     Cholecalciferol (VITAMIN D3) 1000 units CAPS Take 4,000 Int'l Units by mouth.     CHOLINE PO Take 550 mg by mouth.     Chromium 1 MG CAPS Take by mouth. Once a day     Coenzyme Q10 (CO Q 10) 100 MG CAPS Take by mouth.     FOLIC ACID PO Take by mouth.     Garlic 10 MG CAPS Take by mouth. Once a day     Krill Oil 500 MG CAPS Take 500 mg by mouth.     Lutein 20 MG CAPS Take by mouth 1 day or 1 dose.     magnesium 30 MG tablet Take 30 mg by mouth 2 (two) times daily.     Menaquinone-7 (VITAMIN K2) 100 MCG CAPS Take by mouth.     Multiple Vitamins-Minerals (ZINC PO) Take by mouth.      Multiple Vitamins-Minerals (ZINC PO) Take 1 tablet by mouth daily.     SPIRULINA PO Take 400 mg by mouth.     TURMERIC-GINGER PO Take 1 tablet by mouth daily.     doxycycline (VIBRA-TABS) 100 MG tablet Take 1 tablet (100 mg total) by mouth 2 (two) times daily. (Patient not taking: Reported on 09/24/2020) 14 tablet 0   triamcinolone cream (KENALOG) 0.1 % Apply 1 application topically 2 (two) times daily. 30 g 3   vitamin E 400 UNIT capsule Take 400 Units by mouth daily. (Patient not taking: Reported on 09/24/2020)     No facility-administered medications prior to visit.    Allergies  Allergen Reactions   Other Other (See Comments)    Pineapple,honeydew: causes sore throat and sores on tongue    Review of Systems     Objective:    Physical Exam Vitals reviewed.  Constitutional:      Appearance: She is not ill-appearing.  Pulmonary:     Effort: Pulmonary effort is normal.  Skin:    General: Skin is warm and dry.     Findings: Rash present.       Neurological:     Mental Status: She is alert.  Psychiatric:        Behavior: Behavior normal.        Thought Content: Thought content normal.    BP (!) 105/1   Pulse 62  Wt Readings from Last 3 Encounters:  09/24/20 164 lb (74.4 kg)  01/08/20 177 lb (80.3 kg)  08/06/19 170 lb (77.1 kg)    Health Maintenance Due  Topic Date Due   Zoster Vaccines- Shingrix (1 of 2) Never done    There are no preventive care reminders to display for this patient.   Lab Results  Component Value Date   TSH 0.49 10/25/2017   Lab Results  Component Value Date   WBC 6.6 06/05/2018   HGB 15.4 06/05/2018   HCT 46.5 06/05/2018   MCV 86 06/05/2018   PLT 257 06/05/2018   Lab Results  Component Value Date   NA 142 06/05/2018   K 4.5 06/05/2018   CO2 23 06/05/2018   GLUCOSE 99 06/05/2018   BUN 7 (L) 06/05/2018   CREATININE 0.70 06/05/2018   BILITOT 0.4 06/05/2018   ALKPHOS 67 06/05/2018   AST 23 06/05/2018   ALT 20 06/05/2018   PROT  7.6 06/05/2018   ALBUMIN 4.9 (H) 06/05/2018  CALCIUM 10.1 06/05/2018   Lab Results  Component Value Date   CHOL 212 (H) 06/05/2018   Lab Results  Component Value Date   HDL 58 06/05/2018   Lab Results  Component Value Date   LDLCALC 131 (H) 06/05/2018   Lab Results  Component Value Date   TRIG 115 06/05/2018   Lab Results  Component Value Date   CHOLHDL 3.7 06/05/2018   Lab Results  Component Value Date   HGBA1C 5.7 (H) 10/25/2017       Assessment & Plan:   Problem List Items Addressed This Visit       Musculoskeletal and Integument   Intertrigo - Primary   Relevant Medications   nystatin cream (MYCOSTATIN)   nystatin powder   Other Visit Diagnoses     Plant dermatitis       Relevant Medications   triamcinolone cream (KENALOG) 0.1 %      Plant dermatitis (left knee) Localized Counseled on general measures Refrigerate and apply topical corticosteroid bid prn itching up to 14 days Counseled on cycling use of topical steroids  2. Intertrigo (groin and inframammary) Nystatin cream and powder bid-tid to affected areas Avoid topical steroids Keep skin dry   Meds ordered this encounter  Medications   triamcinolone cream (KENALOG) 0.1 %    Sig: Apply 1 application topically 2 (two) times daily. Cycle use 1 week on 1 week off. Do not use on one area more than 14 consecutive days    Dispense:  30 g    Refill:  3    Order Specific Question:   Supervising Provider    Answer:   Emeterio Reeve [[id number]]   nystatin cream (MYCOSTATIN)    Sig: Apply 1 application topically 2 (two) times daily.    Dispense:  30 g    Refill:  3    Order Specific Question:   Supervising Provider    Answer:   Emeterio Reeve [[id number]]   nystatin powder    Sig: Apply 1 application topically 3 (three) times daily.    Dispense:  60 g    Refill:  3    Order Specific Question:   Supervising Provider    Answer:   Emeterio Reeve [[id number]]     Trixie Dredge, PA-C

## 2021-12-21 ENCOUNTER — Other Ambulatory Visit: Payer: Self-pay | Admitting: *Deleted

## 2021-12-22 ENCOUNTER — Encounter: Payer: Self-pay | Admitting: Family Medicine

## 2021-12-22 DIAGNOSIS — Z1211 Encounter for screening for malignant neoplasm of colon: Secondary | ICD-10-CM

## 2022-01-19 ENCOUNTER — Ambulatory Visit: Payer: Medicare HMO | Admitting: Physician Assistant

## 2022-03-24 ENCOUNTER — Encounter: Payer: Self-pay | Admitting: General Practice

## 2022-03-31 DIAGNOSIS — Z1211 Encounter for screening for malignant neoplasm of colon: Secondary | ICD-10-CM | POA: Diagnosis not present

## 2022-03-31 DIAGNOSIS — D123 Benign neoplasm of transverse colon: Secondary | ICD-10-CM | POA: Diagnosis not present

## 2022-03-31 DIAGNOSIS — D122 Benign neoplasm of ascending colon: Secondary | ICD-10-CM | POA: Diagnosis not present

## 2023-10-03 ENCOUNTER — Encounter: Payer: Self-pay | Admitting: Physician Assistant

## 2023-10-03 ENCOUNTER — Ambulatory Visit

## 2023-10-03 ENCOUNTER — Ambulatory Visit (INDEPENDENT_AMBULATORY_CARE_PROVIDER_SITE_OTHER): Payer: Medicare HMO | Admitting: Physician Assistant

## 2023-10-03 VITALS — BP 138/86 | HR 74 | Ht 62.0 in | Wt 173.2 lb

## 2023-10-03 DIAGNOSIS — G8929 Other chronic pain: Secondary | ICD-10-CM

## 2023-10-03 DIAGNOSIS — L255 Unspecified contact dermatitis due to plants, except food: Secondary | ICD-10-CM

## 2023-10-03 DIAGNOSIS — M25512 Pain in left shoulder: Secondary | ICD-10-CM

## 2023-10-03 DIAGNOSIS — L304 Erythema intertrigo: Secondary | ICD-10-CM | POA: Diagnosis not present

## 2023-10-03 DIAGNOSIS — E782 Mixed hyperlipidemia: Secondary | ICD-10-CM

## 2023-10-03 DIAGNOSIS — Z79899 Other long term (current) drug therapy: Secondary | ICD-10-CM | POA: Diagnosis not present

## 2023-10-03 MED ORDER — TRIAMCINOLONE ACETONIDE 0.1 % EX CREA: 1.0000 | TOPICAL_CREAM | Freq: Two times a day (BID) | CUTANEOUS | 3 refills | Status: DC

## 2023-10-03 MED ORDER — NYSTATIN 100000 UNIT/GM EX CREA: 1.0000 | TOPICAL_CREAM | Freq: Two times a day (BID) | CUTANEOUS | 3 refills | Status: DC

## 2023-10-03 MED ORDER — DICLOFENAC SODIUM 1 % EX GEL: 4.0000 g | Freq: Four times a day (QID) | CUTANEOUS | 1 refills | Status: AC

## 2023-10-03 NOTE — Progress Notes (Signed)
 Acute Office Visit  Subjective:     Patient ID: Gina Norton, female    DOB: [birth year], 73 y.o.   MRN: [id number]  CC: left arm pain  HPI Patient is a 73 yo female who presents with chief complaint of left arm pain x2 years. She states the pain is localized to the muscles of her left shoulder. She states the pain comes and goes. She endorses rare numbness and tingling. She denies any trauma or injury to the area.   Last DEXA scan was in 2022 with a T score -3.1.   She needs triamcinolone cream for dermatitis and nystatin for intertrigo.    .. Active Ambulatory Problems    Diagnosis Date Noted   Hyperlipidemia 07/03/2010   GERD 06/02/2010   CHEST PAIN, ATYPICAL 06/02/2010   ELEVATED BLOOD PRESSURE WITHOUT DIAGNOSIS OF HYPERTENSION 07/03/2010   Asymptomatic postmenopausal status 06/02/2010   Rosacea 02/12/2011   Family history of premature coronary heart disease 01/02/2013   First degree ankle sprain, left, initial encounter 07/23/2016   IFG (impaired fasting glucose) 10/26/2017   Osteoporosis 12/05/2020   Intertrigo 01/26/2021   Plant dermatitis 10/04/2023   Resolved Ambulatory Problems    Diagnosis Date Noted   FACIAL RASH 06/02/2010   Shortness of breath 06/02/2010   Past Medical History:  Diagnosis Date   GERD (gastroesophageal reflux disease)      Review of Systems  Musculoskeletal:        Left shoulder and upper arm pain  All other systems reviewed and are negative.     Objective:    BP 138/86 (BP Location: Left Arm, Patient Position: Sitting, Cuff Size: Normal)   Pulse 74   Ht 5\' 2"  (1.575 m)   Wt 173 lb 4 oz (78.6 kg)   SpO2 96%   BMI 31.69 kg/m  BP Readings from Last 3 Encounters:  10/03/23 138/86  01/26/21 (!) 105/1  09/24/20 (!) 107/58   Wt Readings from Last 3 Encounters:  10/03/23 173 lb 4 oz (78.6 kg)  09/24/20 164 lb (74.4 kg)  01/08/20 177 lb (80.3 kg)   SpO2 Readings from Last 3 Encounters:  10/03/23 96%  09/24/20 100%  01/08/20  96%   Physical Exam Constitutional:      Appearance: Normal appearance.  HENT:     Head: Normocephalic and atraumatic.  Eyes:     Extraocular Movements: Extraocular movements intact.  Cardiovascular:     Rate and Rhythm: Normal rate and regular rhythm.     Pulses: Normal pulses.     Heart sounds: Normal heart sounds.  Pulmonary:     Effort: Pulmonary effort is normal.     Breath sounds: Normal breath sounds.  Musculoskeletal:        General: Tenderness present. Normal range of motion.     Cervical back: Normal range of motion.     Comments: Pain with painful arc Pain with Hawkins  Pain with Speed's test  Skin:    General: Skin is warm.     Capillary Refill: Capillary refill takes less than 2 seconds.  Neurological:     Mental Status: She is alert.  Psychiatric:        Mood and Affect: Mood normal.       Assessment & Plan:  Marland KitchenMarland KitchenSanaa was seen today for arm pain.  Diagnoses and all orders for this visit:  Chronic left shoulder pain -     diclofenac Sodium (VOLTAREN) 1 % GEL; Apply 4 g topically 4 (four) times  daily. To affected joint. -     DG Shoulder Left; Future -     Ambulatory referral to Physical Therapy  Plant dermatitis -     triamcinolone cream (KENALOG) 0.1 %; Apply 1 Application topically 2 (two) times daily. Cycle use 1 week on 1 week off. Do not use on one area more than 14 consecutive days  Intertrigo -     nystatin cream (MYCOSTATIN); Apply 1 Application topically 2 (two) times daily.  Medication management -     Lipid panel -     CMP14+EGFR -     VITAMIN D 25 Hydroxy (Vit-D Deficiency, Fractures) -     B12 and Folate Panel -     TSH + free T4 -     CBC w/Diff/Platelet -     Magnesium  Mixed hyperlipidemia -     Lipid panel    - Referral made for PT likely some OA, ? Proximal bicep tendonitis ? Rotator cuff tendonitis  - Order Left shoulder X-ray  - She denies taking any anti-inflammatories at this time and declines taking oral medication for  this - Start voltaren topical gel  - Ordered Fasting labs today - Educated patient on using supplements to reduce inflammation    Tumeric 1000mg  daily Artichok 8000mg  daily  Vitamin C 1000mg  BID Vitamin D 10,000 U daily  Magnesium Glycinate 360mg  daily  K2 one daily  Green tea extract 400mg  daily  Cinnamon 4000mg  daily  Zinc 22mg  daily  CoQ10 one daily  Krill Oil 1000mg  daily  Spirulina Chlorella five daily  Garlic 6000mg  daily  B complex once daily  Astoaxanthin 12mg  daily  Milk Thissel 1000mg  daily   Tandy Gaw, PA-C

## 2023-10-03 NOTE — Patient Instructions (Signed)
 Will order Physical Therapy Use voltaren over left shoulder

## 2023-10-04 ENCOUNTER — Encounter: Payer: Self-pay | Admitting: Physician Assistant

## 2023-10-04 DIAGNOSIS — L255 Unspecified contact dermatitis due to plants, except food: Secondary | ICD-10-CM | POA: Insufficient documentation

## 2023-10-05 DIAGNOSIS — E782 Mixed hyperlipidemia: Secondary | ICD-10-CM | POA: Diagnosis not present

## 2023-10-05 DIAGNOSIS — Z79899 Other long term (current) drug therapy: Secondary | ICD-10-CM | POA: Diagnosis not present

## 2023-10-06 LAB — TSH+FREE T4
Free T4: 1.29 ng/dL (ref 0.82–1.77)
TSH: 0.885 u[IU]/mL (ref 0.450–4.500)

## 2023-10-06 LAB — B12 AND FOLATE PANEL
Folate: >20 ng/mL (ref >3.0)
Vitamin B-12: 1251 pg/mL — ABNORMAL HIGH (ref 232–1245)

## 2023-10-06 LAB — LIPID PANEL
Chol/HDL Ratio: 4.4 ratio (ref 0.0–4.4)
Cholesterol, Total: 248 mg/dL — ABNORMAL HIGH (ref 100–199)
HDL: 57 mg/dL (ref >39)
LDL Chol Calc (NIH): 175 mg/dL — ABNORMAL HIGH (ref 0–99)
Triglycerides: 92 mg/dL (ref 0–149)
VLDL Cholesterol Cal: 16 mg/dL (ref 5–40)

## 2023-10-06 LAB — CMP14+EGFR
ALT: 17 IU/L (ref 0–32)
AST: 17 IU/L (ref 0–40)
Albumin: 4.6 g/dL (ref 3.8–4.8)
Alkaline Phosphatase: 76 IU/L (ref 44–121)
BUN/Creatinine Ratio: 20 (ref 12–28)
BUN: 13 mg/dL (ref 8–27)
Bilirubin Total: 0.3 mg/dL (ref 0.0–1.2)
CO2: 23 mmol/L (ref 20–29)
Calcium: 9.8 mg/dL (ref 8.7–10.3)
Chloride: 106 mmol/L (ref 96–106)
Creatinine, Ser: 0.66 mg/dL (ref 0.57–1.00)
Globulin, Total: 2.8 g/dL (ref 1.5–4.5)
Glucose: 98 mg/dL (ref 70–99)
Potassium: 4.8 mmol/L (ref 3.5–5.2)
Sodium: 142 mmol/L (ref 134–144)
Total Protein: 7.4 g/dL (ref 6.0–8.5)
eGFR: 93 mL/min/{1.73_m2} (ref >59)

## 2023-10-06 LAB — CBC WITH DIFFERENTIAL/PLATELET
Basophils Absolute: 0 10*3/uL (ref 0.0–0.2)
Basos: 1 %
EOS (ABSOLUTE): 0.2 10*3/uL (ref 0.0–0.4)
Eos: 2 %
Hematocrit: 44.7 % (ref 34.0–46.6)
Hemoglobin: 14.9 g/dL (ref 11.1–15.9)
Immature Grans (Abs): 0 10*3/uL (ref 0.0–0.1)
Immature Granulocytes: 0 %
Lymphocytes Absolute: 3.1 10*3/uL (ref 0.7–3.1)
Lymphs: 37 %
MCH: 29 pg (ref 26.6–33.0)
MCHC: 33.3 g/dL (ref 31.5–35.7)
MCV: 87 fL (ref 79–97)
Monocytes Absolute: 0.6 10*3/uL (ref 0.1–0.9)
Monocytes: 7 %
Neutrophils Absolute: 4.4 10*3/uL (ref 1.4–7.0)
Neutrophils: 53 %
Platelets: 239 10*3/uL (ref 150–450)
RBC: 5.13 x10E6/uL (ref 3.77–5.28)
RDW: 12.8 % (ref 11.7–15.4)
WBC: 8.3 10*3/uL (ref 3.4–10.8)

## 2023-10-06 LAB — MAGNESIUM: Magnesium: 2.1 mg/dL (ref 1.6–2.3)

## 2023-10-06 LAB — VITAMIN D 25 HYDROXY (VIT D DEFICIENCY, FRACTURES): Vit D, 25-Hydroxy: 83.5 ng/mL (ref 30.0–100.0)

## 2023-10-09 ENCOUNTER — Encounter: Payer: Self-pay | Admitting: Physician Assistant

## 2023-10-09 NOTE — Progress Notes (Signed)
 [place],   B12 a little on the elevated side. You can cut back on b12.   Kidney, liver, and glucose look good.   Vitamin D looks great.   Thyroid normal range.   Hemoglobin looks good.   Magnesium looks good.   10 year cardiovascular risk is elevated and LDL not to goal therefore a statin would be suggested to lower LDL and CV risk. Thoughts?   Marland KitchenMarland KitchenThe 10-year ASCVD risk score (Arnett DK, et al., 2019) is: 14%   Values used to calculate the score:     Age: 73 years     Sex: Female     Is Non-Hispanic African American: No     Diabetic: No     Tobacco smoker: No     Systolic Blood Pressure: 138 mmHg     Is BP treated: No     HDL Cholesterol: 57 mg/dL     Total Cholesterol: 248 mg/dL

## 2023-10-18 ENCOUNTER — Encounter: Payer: Self-pay | Admitting: Physician Assistant

## 2023-10-18 DIAGNOSIS — M19012 Primary osteoarthritis, left shoulder: Secondary | ICD-10-CM | POA: Insufficient documentation

## 2023-10-18 NOTE — Progress Notes (Signed)
 You have some AC joint spurring. You may benefit from injection. Would you like to schedule with Dr. Karie Schwalbe in our office?

## 2023-11-16 ENCOUNTER — Encounter: Payer: Self-pay | Admitting: Physician Assistant

## 2023-11-16 DIAGNOSIS — L255 Unspecified contact dermatitis due to plants, except food: Secondary | ICD-10-CM

## 2023-11-16 DIAGNOSIS — L304 Erythema intertrigo: Secondary | ICD-10-CM

## 2023-11-16 MED ORDER — NYSTATIN 100000 UNIT/GM EX CREA
1.0000 | TOPICAL_CREAM | Freq: Two times a day (BID) | CUTANEOUS | 3 refills | Status: AC
Start: 2023-11-16 — End: ?

## 2023-11-16 MED ORDER — TRIAMCINOLONE ACETONIDE 0.1 % EX CREA: 1.0000 | TOPICAL_CREAM | Freq: Two times a day (BID) | CUTANEOUS | 3 refills | Status: AC

## 2024-03-05 ENCOUNTER — Ambulatory Visit (INDEPENDENT_AMBULATORY_CARE_PROVIDER_SITE_OTHER): Admitting: Sports Medicine

## 2024-03-05 ENCOUNTER — Ambulatory Visit

## 2024-03-05 DIAGNOSIS — M5080 Other cervical disc disorders, unspecified cervical region: Secondary | ICD-10-CM | POA: Diagnosis not present

## 2024-03-05 DIAGNOSIS — R200 Anesthesia of skin: Secondary | ICD-10-CM | POA: Diagnosis not present

## 2024-03-05 DIAGNOSIS — M4802 Spinal stenosis, cervical region: Secondary | ICD-10-CM | POA: Diagnosis not present

## 2024-03-05 DIAGNOSIS — G8929 Other chronic pain: Secondary | ICD-10-CM

## 2024-03-05 DIAGNOSIS — M25512 Pain in left shoulder: Secondary | ICD-10-CM

## 2024-03-05 DIAGNOSIS — M47812 Spondylosis without myelopathy or radiculopathy, cervical region: Secondary | ICD-10-CM | POA: Diagnosis not present

## 2024-03-05 MED ORDER — GABAPENTIN 100 MG PO CAPS: ORAL_CAPSULE | ORAL | 11 refills | Status: AC

## 2024-03-05 MED ORDER — ACETAMINOPHEN ER 650 MG PO TBCR
650.0000 mg | EXTENDED_RELEASE_TABLET | Freq: Three times a day (TID) | ORAL | Status: AC | PRN
Start: 2024-03-05 — End: ?

## 2024-03-05 NOTE — Patient Instructions (Signed)

## 2024-03-05 NOTE — Progress Notes (Signed)
    Procedures performed today:    None.  Independent interpretation of notes and tests performed by another provider:   None.  Brief History, Exam, Impression, and Recommendations:    Left shoulder pain 73 year old female known cervical DDD from x-rays in 2019, 2 years of pain radiating from the neck down to the thumb. Occasional numbness and tingling, somewhat better with abduction of the left shoulder. On exam she has no impingement signs, she does have a positive speeds test. She does have a positive Spurling sign as well. Suspect more of a radicular etiology. We discussed the anatomy and pathophysiology of her Adding low-dose Neurontin  at night, cervical spine x-rays, formal physical therapy for her shoulder and neck. Return to see me 6 weeks.    ____________________________________________ Debby PARAS. Curtis, M.D., ABFM., CAQSM., AME. Primary Care and Sports Medicine [facility]  Adjunct Professor of [facility] Medicine  [employer]  Restaurant manager, fast food

## 2024-03-05 NOTE — Assessment & Plan Note (Signed)
 73 year old female known cervical DDD from x-rays in 2019, 2 years of pain radiating from the neck down to the thumb. Occasional numbness and tingling, somewhat better with abduction of the left shoulder. On exam she has no impingement signs, she does have a positive speeds test. She does have a positive Spurling sign as well. Suspect more of a radicular etiology. We discussed the anatomy and pathophysiology of her Adding low-dose Neurontin  at night, cervical spine x-rays, formal physical therapy for her shoulder and neck. Return to see me 6 weeks.

## 2024-03-15 ENCOUNTER — Ambulatory Visit: Payer: Self-pay | Admitting: Sports Medicine

## 2024-03-16 NOTE — Therapy (Signed)
 OUTPATIENT PHYSICAL THERAPY SHOULDER EVALUATION   Patient Name: Gina Norton MRN: [id number] DOB:June 03, 1951, 73 y.o., female Today's Date: 03/19/2024  END OF SESSION:  PT End of Session - 03/19/24 1404     Visit Number 1    Number of Visits 9    Date for PT Re-Evaluation 05/14/24    Authorization Type human[employer] medicare    Authorization Time Period auth tbd    Progress Note Due on Visit 10    PT Start Time 1404    PT Stop Time 1448    PT Time Calculation (min) 44 min          Past Medical History:  Diagnosis Date   GERD (gastroesophageal reflux disease)    History reviewed. No pertinent surgical history. Patient Active Problem List   Diagnosis Date Noted   Left shoulder pain 03/05/2024   Arthritis of left acromioclavicular joint 10/18/2023   Plant dermatitis 10/04/2023   Intertrigo 01/26/2021   Osteoporosis 12/05/2020   IFG (impaired fasting glucose) 10/26/2017   First degree ankle sprain, left, initial encounter 07/23/2016   Family history of premature coronary heart disease 01/02/2013   Rosacea 02/12/2011   Hyperlipidemia 07/03/2010   ELEVATED BLOOD PRESSURE WITHOUT DIAGNOSIS OF HYPERTENSION 07/03/2010   GERD 06/02/2010   CHEST PAIN, ATYPICAL 06/02/2010   Asymptomatic postmenopausal status 06/02/2010    PCP: Alvan Dorothyann BIRCH, MD  REFERRING PROVIDER: Curtis Debby PARAS, MD  REFERRING DIAG: [phone] (ICD-10-CM) - Chronic left shoulder pain  THERAPY DIAG:  Chronic left shoulder pain  Muscle weakness (generalized)  Rationale for Evaluation and Treatment: Rehabilitation  ONSET DATE: about 2 years  SUBJECTIVE:                                                                                                                                                                                      SUBJECTIVE STATEMENT: Progressively worsening over last couple years, no MOI or change in activity. States at first she thought she had frozen shoulder but  tried to keep using it.  Describes a little bit of numbness in L wrist, pain/tightness in elbow. Sometimes will have L UT and shoulder pain but mostly anterior shoulder into elbow. Does feel that she's lost some strength in her hand. Having difficulty sleeping due to positioning, usually a side sleeper No R sided pain or numbness. No headaches or dizziness.  Hand dominance: Right  PERTINENT HISTORY: GERD, osteoporosis  PAIN:  Are you having pain: 1/10  Location/description: L shoulder/elbow  Best-worst over past week: 0-9/10 - aggravating factors: reaching with arm, lifting heavy items, prolonged UE use, sleeping  - Easing factors: massage, rest  PRECAUTIONS: osteoporosis  RED FLAGS: None   WEIGHT BEARING RESTRICTIONS: No  FALLS:  Has patient fallen in last 6 months? No  LIVING ENVIRONMENT: Lives w/ husband, kids, and grandkids 2 story home, no issues w/ stairs; main level livable   OCCUPATION: Homemaker   PLOF: Independent   PATIENT GOALS: would like to be able to sew when needed, less pain with usual activities, build up strength and mobility  NEXT MD VISIT: after PT  OBJECTIVE:  Note: Objective measures were completed at Evaluation unless otherwise noted.  DIAGNOSTIC FINDINGS:  10/03/23 L shoulder XR: IMPRESSION: Minor acromioclavicular spurring.  03/05/24 C spine XR: IMPRESSION: Mild to moderate spondylosis of the cervical spine with multilevel disc disease and multilevel neural foraminal narrowing as described. Neural foraminal narrowing worse along the right side of the C4-5 level.  PATIENT SURVEYS:  QuickDASH: 47.73%  COGNITION: Overall cognitive status: Within functional limits for tasks assessed     SENSATION: LT intact BIL UE   POSTURE: Rounded shoulders, UT elevation BIL, FHP  UPPER EXTREMITY ROM:  A/PROM Right eval Left eval  Shoulder flexion 135 deg 130 deg more pain on lowering  Shoulder abduction 130 deg 115 deg  Shoulder  internal rotation    Shoulder external rotation    Elbow flexion    Elbow extension    Wrist flexion    Wrist extension     (Blank rows = not tested) (Key: WFL = within functional limits not formally assessed, * = concordant pain, s = stiffness/stretching sensation, NT = not tested)  Comments:    UPPER EXTREMITY MMT:  MMT Right eval Left eval  Shoulder flexion 5 4 *  Shoulder extension    Shoulder abduction 5 4 *  Shoulder extension    Shoulder internal rotation 5 4 *  Shoulder external rotation 5 4 *  Elbow flexion 5 4+  Elbow extension 5 4+   Grip strength 40#/40# 30#/30#  (Blank rows = not tested)  (Key: WFL = within functional limits not formally assessed, * = concordant pain, s = stiffness/stretching sensation, NT = not tested)  Comments:                                                                                                                               TREATMENT DATE:  [facility] Adult PT Treatment:                                                DATE: 03/19/24 Therapeutic Exercise: Wall slides, scapular retraction, abduction isometric practice reps; HEP handout + education, relevant anatomy/physiology and rationale for interventions  Self Care: Education on exam findings as they relate to symptom behavior, sleep positioning, PT POC    PATIENT EDUCATION: Education details: Pt education on PT impairments, prognosis, and POC. Informed consent. Rationale for interventions, safe/appropriate HEP performance  Person educated: Patient Education method: Explanation, Demonstration, Tactile cues, Verbal cues Education comprehension: verbalized understanding, returned demonstration, verbal cues required, tactile cues required, and needs further education    HOME EXERCISE PROGRAM: Access Code: [facility] URL: [email or web address] Date: 03/19/2024 Prepared by: Alm Jenny  Exercises - Shoulder Flexion Wall Slide with Towel  - 2-3 x daily - 1 sets - 8-10  reps - Isometric Shoulder Flexion at Wall  - 2-3 x daily - 1 sets - 8 reps - Isometric Shoulder Abduction at Wall  - 2-3 x daily - 1 sets - 8 reps  ASSESSMENT:  CLINICAL IMPRESSION: Patient is a 73 y.o. woman who was seen today for physical therapy evaluation and treatment for L shoulder pain ongoing over last couple of years. She reports increased pain with usual household and self care tasks. On exam she demonstrates concordant limitations in [facility] mobility/strength, reduced elbow strength, and postural deficits. Increased symptoms gradually through exam but recovers well with rest, does well with HEP but does endorse muscular fatigue (nonpainful). No adverse events. Recommend trial of skilled PT to address aforementioned deficits with aim of improving functional tolerance and reducing pain with typical activities. Pt departs today's session in no acute distress, all voiced concerns/questions addressed appropriately from PT perspective.      OBJECTIVE IMPAIRMENTS: decreased activity tolerance, decreased endurance, decreased mobility, decreased ROM, decreased strength, hypomobility, impaired perceived functional ability, impaired UE functional use, postural dysfunction, and pain.   ACTIVITY LIMITATIONS: carrying, lifting, standing, reach over head, and hygiene/grooming  PARTICIPATION LIMITATIONS: meal prep, cleaning, laundry, and community activity  PERSONAL FACTORS: Age, Time since onset of injury/illness/exacerbation, and 1-2 comorbidities: GERD, osteoporosis are also affecting patient's functional outcome.   REHAB POTENTIAL: Good  CLINICAL DECISION MAKING: Evolving/moderate complexity  EVALUATION COMPLEXITY: Moderate   GOALS:  SHORT TERM GOALS: Target date: 04/16/2024  Pt will demonstrate appropriate understanding and performance of initially prescribed HEP in order to facilitate improved independence with management of symptoms.  Baseline: HEP established  Goal status: INITIAL   2.  Pt will report at least 25% improvement in overall pain levels over past week in order to facilitate improved tolerance to typical daily activities.   Baseline: 0-9/10  Goal status: INITIAL    LONG TERM GOALS: Target date: 05/14/2024   Pt will score less than or equal to 30% on Quick DASH in order to indicate reduced levels of disability due to shoulder pain (MDC 16-20pts).  Baseline: 47.73%   Goal status: INITIAL  2.  Pt will demonstrate at least 135 degrees of L active shoulder elevation in order to demonstrate improved tolerance to functional movement patterns such as reaching overhead.  Baseline: see ROM chart above Goal status: INITIAL  3.  Pt will demonstrate at least 4+/5 shoulder flex/ER/IR MMT bilaterally for improved symmetry of UE strength and improved tolerance to functional movements.  Baseline: see MMT chart above Goal status: INITIAL  4. Pt will demonstrate ability to manipulate objects <5# at waist level for up to 2 min w/o increase in pain in order to facilitate improved tolerance to usual household tasks.  Baseline: pain with lifting pots/pans, especially repetitively  Goal status: INITIAL   5. Pt will endorse at least 50% increase in sleep quality d/t shoulder pain in order to facilitate improved overall health and QOL.  Baseline: difficulty sleeping  Goal status: INITIAL  6. Pt will report at least 50% decrease in overall pain levels in past week in order to facilitate improved tolerance to basic ADLs/mobility.  Baseline: 0-9/10  Goal status: INITIAL    PLAN:  PT FREQUENCY:1-2x/week  PT DURATION: 8 weeks  PLANNED INTERVENTIONS: 97164- PT Re-evaluation, 97750- Physical Performance Testing, 97110-Therapeutic exercises, 97530- Therapeutic activity, [id number]- Neuromuscular re-education, 97535- Self Care, 02859- Manual therapy, 20560 (1-2 muscles), 20561 (3+ muscles)- Dry Needling, Patient/Family education, Taping, Joint mobilization, Spinal mobilization, Cryotherapy,  and Moist heat  PLAN FOR NEXT SESSION: Review/update HEP PRN. Work on [employer] exercises as appropriate with emphasis on GH stability, periscapular strength, cervicothoracic and GH mobility. Symptom modification strategies as indicated/appropriate, mindful of osteoporosis. Pt states she would prefer to start at 1x/week d/t finances   Alm DELENA Jenny PT, DPT 03/19/2024 4:04 PM    Referring diagnosis? M25.512,G89.29 (ICD-10-CM) - Chronic left shoulder pain   Treatment diagnosis? (if different than referring diagnosis) Chronic left shoulder pain   Muscle weakness (generalized)  What was this (referring dx) caused by? []  Surgery []  Fall [x]  Ongoing issue []  Arthritis []  Other: ____________  Laterality: []  Rt [x]  Lt []  Both  Check all possible CPT codes:  *CHOOSE 10 OR LESS*    See Planned Interventions listed in the Plan section of the Evaluation.

## 2024-03-19 ENCOUNTER — Encounter: Payer: Self-pay | Admitting: Physical Therapy

## 2024-03-19 ENCOUNTER — Other Ambulatory Visit: Payer: Self-pay

## 2024-03-19 ENCOUNTER — Ambulatory Visit: Attending: Sports Medicine | Admitting: Physical Therapy

## 2024-03-19 DIAGNOSIS — M25512 Pain in left shoulder: Secondary | ICD-10-CM | POA: Diagnosis present

## 2024-03-19 DIAGNOSIS — M6281 Muscle weakness (generalized): Secondary | ICD-10-CM | POA: Insufficient documentation

## 2024-03-19 DIAGNOSIS — G8929 Other chronic pain: Secondary | ICD-10-CM | POA: Diagnosis present

## 2024-03-21 DIAGNOSIS — L720 Epidermal cyst: Secondary | ICD-10-CM | POA: Diagnosis not present

## 2024-03-21 DIAGNOSIS — L738 Other specified follicular disorders: Secondary | ICD-10-CM | POA: Diagnosis not present

## 2024-03-21 DIAGNOSIS — L821 Other seborrheic keratosis: Secondary | ICD-10-CM | POA: Diagnosis not present

## 2024-03-21 DIAGNOSIS — D1801 Hemangioma of skin and subcutaneous tissue: Secondary | ICD-10-CM | POA: Diagnosis not present

## 2024-03-21 DIAGNOSIS — D3611 Benign neoplasm of peripheral nerves and autonomic nervous system of face, head, and neck: Secondary | ICD-10-CM | POA: Diagnosis not present

## 2024-03-21 DIAGNOSIS — D485 Neoplasm of uncertain behavior of skin: Secondary | ICD-10-CM | POA: Diagnosis not present

## 2024-03-28 ENCOUNTER — Ambulatory Visit

## 2024-03-28 DIAGNOSIS — M6281 Muscle weakness (generalized): Secondary | ICD-10-CM | POA: Diagnosis not present

## 2024-03-28 DIAGNOSIS — G8929 Other chronic pain: Secondary | ICD-10-CM | POA: Diagnosis not present

## 2024-03-28 DIAGNOSIS — M25512 Pain in left shoulder: Secondary | ICD-10-CM | POA: Diagnosis not present

## 2024-03-28 NOTE — Therapy (Signed)
 OUTPATIENT PHYSICAL THERAPY SHOULDER TREATMENT   Patient Name: Gina Norton MRN: [id number] DOB:1951/01/08, 73 y.o., female Today's Date: 03/28/2024  END OF SESSION:  PT End of Session - 03/28/24 1448     Visit Number 2    Number of Visits 9    Date for PT Re-Evaluation 05/14/24    Authorization Type [employer]    Authorization Time Period 9 visits approved for PT 03/19/2024-05/14/2024    Authorization - Visit Number 2    Authorization - Number of Visits 9    Progress Note Due on Visit 10    PT Start Time 1448    PT Stop Time 1536    PT Time Calculation (min) 48 min    Activity Tolerance Patient tolerated treatment well    Behavior During Therapy WFL for tasks assessed/performed         Past Medical History:  Diagnosis Date   GERD (gastroesophageal reflux disease)    History reviewed. No pertinent surgical history. Patient Active Problem List   Diagnosis Date Noted   Left shoulder pain 03/05/2024   Arthritis of left acromioclavicular joint 10/18/2023   Plant dermatitis 10/04/2023   Intertrigo 01/26/2021   Osteoporosis 12/05/2020   IFG (impaired fasting glucose) 10/26/2017   First degree ankle sprain, left, initial encounter 07/23/2016   Family history of premature coronary heart disease 01/02/2013   Rosacea 02/12/2011   Hyperlipidemia 07/03/2010   ELEVATED BLOOD PRESSURE WITHOUT DIAGNOSIS OF HYPERTENSION 07/03/2010   GERD 06/02/2010   CHEST PAIN, ATYPICAL 06/02/2010   Asymptomatic postmenopausal status 06/02/2010    PCP: Alvan Dorothyann BIRCH, MD  REFERRING PROVIDER: Curtis Debby PARAS, MD  REFERRING DIAG: [phone] (ICD-10-CM) - Chronic left shoulder pain  THERAPY DIAG:  Chronic left shoulder pain  Muscle weakness (generalized)  Rationale for Evaluation and Treatment: Rehabilitation  ONSET DATE: about 2 years  SUBJECTIVE:                                                                                                                                                                                       SUBJECTIVE STATEMENT: Patient reports she has not been using arm much today and doesn't have any N/T; states she has 4/10 pain along upper arm. Patient states she had pain in palm of hand after completing HEP.   EVAL: Progressively worsening over last couple years, no MOI or change in activity. States at first she thought she had frozen shoulder but tried to keep using it.  Describes a little bit of numbness in L wrist, pain/tightness in elbow. Sometimes will have L UT and shoulder pain but mostly anterior shoulder into elbow. Does feel that she's  lost some strength in her hand. Having difficulty sleeping due to positioning, usually a side sleeper No R sided pain or numbness. No headaches or dizziness.  Hand dominance: Right  PERTINENT HISTORY: GERD, osteoporosis  PAIN:  Are you having pain: 4/10  Location/description: L shoulder/elbow  Best-worst over past week: 0-9/10 - aggravating factors: reaching with arm, lifting heavy items, prolonged UE use, sleeping  - Easing factors: massage, rest  PRECAUTIONS: osteoporosis   RED FLAGS: None   WEIGHT BEARING RESTRICTIONS: No  FALLS:  Has patient fallen in last 6 months? No  LIVING ENVIRONMENT: Lives w/ husband, kids, and grandkids 2 story home, no issues w/ stairs; main level livable   OCCUPATION: Homemaker   PLOF: Independent   PATIENT GOALS: would like to be able to sew when needed, less pain with usual activities, build up strength and mobility  NEXT MD VISIT: after PT  OBJECTIVE:  Note: Objective measures were completed at Evaluation unless otherwise noted.  DIAGNOSTIC FINDINGS:  10/03/23 L shoulder XR: IMPRESSION: Minor acromioclavicular spurring.  03/05/24 C spine XR: IMPRESSION: Mild to moderate spondylosis of the cervical spine with multilevel disc disease and multilevel neural foraminal narrowing as described. Neural foraminal narrowing worse along  the right side of the C4-5 level.  PATIENT SURVEYS:  QuickDASH: 47.73%  COGNITION: Overall cognitive status: Within functional limits for tasks assessed     SENSATION: LT intact BIL UE   POSTURE: Rounded shoulders, UT elevation BIL, FHP  UPPER EXTREMITY ROM:  A/PROM Right eval Left eval  Shoulder flexion 135 deg 130 deg more pain on lowering  Shoulder abduction 130 deg 115 deg  Shoulder internal rotation    Shoulder external rotation    Elbow flexion    Elbow extension    Wrist flexion    Wrist extension     (Blank rows = not tested) (Key: WFL = within functional limits not formally assessed, * = concordant pain, s = stiffness/stretching sensation, NT = not tested)  Comments:    UPPER EXTREMITY MMT:  MMT Right eval Left eval  Shoulder flexion 5 4 *  Shoulder extension    Shoulder abduction 5 4 *  Shoulder extension    Shoulder internal rotation 5 4 *  Shoulder external rotation 5 4 *  Elbow flexion 5 4+  Elbow extension 5 4+   Grip strength 40#/40# 30#/30#  (Blank rows = not tested)  (Key: WFL = within functional limits not formally assessed, * = concordant pain, s = stiffness/stretching sensation, NT = not tested)  Comments:                                                                                                                               [facility] Adult PT Treatment:  DATE: 03/28/2024 Therapeutic Exercise: Shoulder shrugs Bkwd shoulder circles Seated UT & LS stretches  Neuromuscular re-ed: Standing: Scapula retraction with noodle  Unilateral shoulder ER --> added yellow TB  Bilateral shoulder ER + yellow TB Standing shoulder flexion isometric (Lt) Seated shoulder abd isometric against table Therapeutic Activity: Shoulder flexion wall slides: bilateral (no pain) --> Lt only (pain in elbow) Wall clock (Lt) Scaption wall slides Doorway pec stretch with arms low --> did not tolerate Corner pec  stretch to tolerance 3x10 Seated: Red therabar twisting  Red therabar frowns/smiles --> wrist pain with frown Open book W arms  Modalities: Moist heat (Lt) shoulder x 10 min  TREATMENT DATE:  [facility] Adult PT Treatment:                                                DATE: 03/19/24 Therapeutic Exercise: Wall slides, scapular retraction, abduction isometric practice reps; HEP handout + education, relevant anatomy/physiology and rationale for interventions  Self Care: Education on exam findings as they relate to symptom behavior, sleep positioning, PT POC    PATIENT EDUCATION: Education details: HEP updated Person educated: Patient Education method: Explanation, Demonstration, Tactile cues, Verbal cues Education comprehension: verbalized understanding, returned demonstration, verbal cues required, tactile cues required, and needs further education    HOME EXERCISE PROGRAM: Access Code: [facility] URL: [email or web address] Date: 03/28/2024 Prepared by: Lamarr Price  Exercises - Shoulder Flexion Wall Slide with Towel  - 2-3 x daily - 1 sets - 8-10 reps - Isometric Shoulder Flexion at Wall  - 2-3 x daily - 1 sets - 8 reps - Isometric Shoulder Abduction at Wall  - 2-3 x daily - 1 sets - 8 reps - Wall Clock  - 1 x daily - 7 x weekly - 3 sets - 10 reps - Standing Bilateral Shoulder Scaption Wall Slide  - 1 x daily - 7 x weekly - 3 sets - 10 reps - Standing Shoulder External Rotation with Resistance  - 1 x daily - 7 x weekly - 3 sets - 10 reps - Corner Pec Major Stretch  - 2-3 x daily - 1 sets - 3 reps - 10 sec hold  ASSESSMENT:  CLINICAL IMPRESSION: Postural lean compensation noted during standing shoulder isometrics; modifying to seated position improved body mechanics. Increased shoulder pain with wall slides in abduction; cued to maintain movement in pain-free range. Better tolerance with pec stretch in corner versus doorway. Patient had difficulty maintaining scapula  retraction activation during standing postural exercises and required frequent tactile cues.   EVAL: Patient is a 73 y.o. woman who was seen today for physical therapy evaluation and treatment for L shoulder pain ongoing over last couple of years. She reports increased pain with usual household and self care tasks. On exam she demonstrates concordant limitations in [facility] mobility/strength, reduced elbow strength, and postural deficits. Increased symptoms gradually through exam but recovers well with rest, does well with HEP but does endorse muscular fatigue (nonpainful). No adverse events. Recommend trial of skilled PT to address aforementioned deficits with aim of improving functional tolerance and reducing pain with typical activities. Pt departs today's session in no acute distress, all voiced concerns/questions addressed appropriately from PT perspective.      OBJECTIVE IMPAIRMENTS: decreased activity tolerance, decreased endurance, decreased mobility, decreased ROM, decreased strength, hypomobility, impaired perceived functional ability, impaired UE functional use, postural dysfunction,  and pain.   ACTIVITY LIMITATIONS: carrying, lifting, standing, reach over head, and hygiene/grooming  PARTICIPATION LIMITATIONS: meal prep, cleaning, laundry, and community activity  PERSONAL FACTORS: Age, Time since onset of injury/illness/exacerbation, and 1-2 comorbidities: GERD, osteoporosis are also affecting patient's functional outcome.   REHAB POTENTIAL: Good  CLINICAL DECISION MAKING: Evolving/moderate complexity  EVALUATION COMPLEXITY: Moderate   GOALS:  SHORT TERM GOALS: Target date: 04/16/2024  Pt will demonstrate appropriate understanding and performance of initially prescribed HEP in order to facilitate improved independence with management of symptoms.  Baseline: HEP established  Goal status: INITIAL   2. Pt will report at least 25% improvement in overall pain levels over past week in order  to facilitate improved tolerance to typical daily activities.   Baseline: 0-9/10  Goal status: INITIAL    LONG TERM GOALS: Target date: 05/14/2024   Pt will score less than or equal to 30% on Quick DASH in order to indicate reduced levels of disability due to shoulder pain (MDC 16-20pts).  Baseline: 47.73%   Goal status: INITIAL  2.  Pt will demonstrate at least 135 degrees of L active shoulder elevation in order to demonstrate improved tolerance to functional movement patterns such as reaching overhead.  Baseline: see ROM chart above Goal status: INITIAL  3.  Pt will demonstrate at least 4+/5 shoulder flex/ER/IR MMT bilaterally for improved symmetry of UE strength and improved tolerance to functional movements.  Baseline: see MMT chart above Goal status: INITIAL  4. Pt will demonstrate ability to manipulate objects <5# at waist level for up to 2 min w/o increase in pain in order to facilitate improved tolerance to usual household tasks.  Baseline: pain with lifting pots/pans, especially repetitively  Goal status: INITIAL   5. Pt will endorse at least 50% increase in sleep quality d/t shoulder pain in order to facilitate improved overall health and QOL.  Baseline: difficulty sleeping  Goal status: INITIAL  6. Pt will report at least 50% decrease in overall pain levels in past week in order to facilitate improved tolerance to basic ADLs/mobility.   Baseline: 0-9/10  Goal status: INITIAL    PLAN:  PT FREQUENCY:1-2x/week  PT DURATION: 8 weeks  PLANNED INTERVENTIONS: 97164- PT Re-evaluation, 97750- Physical Performance Testing, 97110-Therapeutic exercises, 97530- Therapeutic activity, [id number]- Neuromuscular re-education, 97535- Self Care, 02859- Manual therapy, 20560 (1-2 muscles), 20561 (3+ muscles)- Dry Needling, Patient/Family education, Taping, Joint mobilization, Spinal mobilization, Cryotherapy, and Moist heat  PLAN FOR NEXT SESSION: Review/update HEP PRN. Work on [employer]  exercises as appropriate with emphasis on GH stability, periscapular strength, cervicothoracic and GH mobility. Symptom modification strategies as indicated/appropriate, mindful of osteoporosis.   Lamarr Price, PTA 03/28/2024 3:46 PM

## 2024-04-03 ENCOUNTER — Encounter: Payer: Self-pay | Admitting: Sports Medicine

## 2024-04-05 ENCOUNTER — Ambulatory Visit: Attending: Sports Medicine

## 2024-04-05 DIAGNOSIS — M25512 Pain in left shoulder: Secondary | ICD-10-CM | POA: Diagnosis present

## 2024-04-05 DIAGNOSIS — M6281 Muscle weakness (generalized): Secondary | ICD-10-CM | POA: Insufficient documentation

## 2024-04-05 DIAGNOSIS — G8929 Other chronic pain: Secondary | ICD-10-CM | POA: Diagnosis present

## 2024-04-05 NOTE — Therapy (Signed)
 OUTPATIENT PHYSICAL THERAPY SHOULDER TREATMENT   Patient Name: Gina Norton MRN: [id number] DOB:[birth year], 73 y.o., female Today's Date: 04/05/2024  END OF SESSION:  PT End of Session - 04/05/24 1450     Visit Number 3    Number of Visits 9    Date for PT Re-Evaluation 05/14/24    Authorization Type [employer] medic[employer]    Authorization Time Period 9 visits approved for PT 03/19/2024-05/14/2024    Authorization - Visit Number 3    Authorization - Number of Visits 9    PT Start Time 1452    PT Stop Time 1540    PT Time Calculation (min) 48 min    Activity Tolerance Patient tolerated treatment well    Behavior During Therapy WFL for tasks assessed/performed         Past Medical History:  Diagnosis Date   GERD (gastroesophageal reflux disease)    History reviewed. No pertinent surgical history. Patient Active Problem List   Diagnosis Date Noted   Left shoulder pain 03/05/2024   Arthritis of left acromioclavicular joint 10/18/2023   Plant dermatitis 10/04/2023   Intertrigo 01/26/2021   Osteoporosis 12/05/2020   IFG (impaired fasting glucose) 10/26/2017   First degree ankle sprain, left, initial encounter 07/23/2016   Family history of premature coronary heart disease 01/02/2013   Rosacea 02/12/2011   Hyperlipidemia 07/03/2010   ELEVATED BLOOD PRESSURE WITHOUT DIAGNOSIS OF HYPERTENSION 07/03/2010   GERD 06/02/2010   CHEST PAIN, ATYPICAL 06/02/2010   Asymptomatic postmenopausal status 06/02/2010    PCP: Alvan Dorothyann BIRCH, MD  REFERRING PROVIDER: Curtis Debby PARAS, MD  REFERRING DIAG: [phone] (ICD-10-CM) - Chronic left shoulder pain  THERAPY DIAG:  Muscle weakness (generalized)  Chronic left shoulder pain  Rationale for Evaluation and Treatment: Rehabilitation  ONSET DATE: about 2 years  SUBJECTIVE:                                                                                                                                                                                       SUBJECTIVE STATEMENT: Patient reports overall her shoulder is feeling a little bit better and stronger; states she had one more episode of palm pain but it was brief, 2-3 min before resolving on its own.   EVAL: Progressively worsening over last couple years, no MOI or change in activity. States at first she thought she had frozen shoulder but tried to keep using it.  Describes a little bit of numbness in L wrist, pain/tightness in elbow. Sometimes will have L UT and shoulder pain but mostly anterior shoulder into elbow. Does feel that she's lost some strength in her hand. Having difficulty sleeping due to  positioning, usually a side sleeper No R sided pain or numbness. No headaches or dizziness.  Hand dominance: Right  PERTINENT HISTORY: GERD, osteoporosis  PAIN:  Are you having pain: 4/10  Location/description: L shoulder/elbow  Best-worst over past week: 0-9/10 - aggravating factors: reaching with arm, lifting heavy items, prolonged UE use, sleeping  - Easing factors: massage, rest  PRECAUTIONS: osteoporosis   RED FLAGS: None   WEIGHT BEARING RESTRICTIONS: No  FALLS:  Has patient fallen in last 6 months? No  LIVING ENVIRONMENT: Lives w/ husband, kids, and grandkids 2 story home, no issues w/ stairs; main level livable   OCCUPATION: Homemaker   PLOF: Independent   PATIENT GOALS: would like to be able to sew when needed, less pain with usual activities, build up strength and mobility  NEXT MD VISIT: after PT  OBJECTIVE:  Note: Objective measures were completed at Evaluation unless otherwise noted.  DIAGNOSTIC FINDINGS:  10/03/23 L shoulder XR: IMPRESSION: Minor acromioclavicular spurring.  03/05/24 C spine XR: IMPRESSION: Mild to moderate spondylosis of the cervical spine with multilevel disc disease and multilevel neural foraminal narrowing as described. Neural foraminal narrowing worse along the right side of the  C4-5 level.  PATIENT SURVEYS:  QuickDASH: 47.73%  COGNITION: Overall cognitive status: Within functional limits for tasks assessed     SENSATION: LT intact BIL UE   POSTURE: Rounded shoulders, UT elevation BIL, FHP  UPPER EXTREMITY ROM:  A/PROM Right eval Left eval  Shoulder flexion 135 deg 130 deg more pain on lowering  Shoulder abduction 130 deg 115 deg  Shoulder internal rotation    Shoulder external rotation    Elbow flexion    Elbow extension    Wrist flexion    Wrist extension     (Blank rows = not tested) (Key: WFL = within functional limits not formally assessed, * = concordant pain, s = stiffness/stretching sensation, NT = not tested)  Comments:    UPPER EXTREMITY MMT:  MMT Right eval Left eval  Shoulder flexion 5 4 *  Shoulder extension    Shoulder abduction 5 4 *  Shoulder extension    Shoulder internal rotation 5 4 *  Shoulder external rotation 5 4 *  Elbow flexion 5 4+  Elbow extension 5 4+   Grip strength 40#/40# 30#/30#  (Blank rows = not tested)  (Key: WFL = within functional limits not formally assessed, * = concordant pain, s = stiffness/stretching sensation, NT = not tested)  Comments:    [facility] Adult PT Treatment:                                                DATE: 04/05/2024 Therapeutic Exercise: Shoulder shrugs Bkwd shoulder circles Seated UT & LS stretches  Corner pec stretch 2x30 --> doorway pec stretch with arm low Lt bicep stretch holding edge of table  Neuromuscular re-ed: Standing with back against noodle: Scap retraction 10x5 Bent arm shoulder ER  --> added yellow TB S/L (Lt) shoulder ER + 1#DB Therapeutic Activity: Shoulder flexion wall (Lt) + tactile cues for scap stability Wall clock --> added yellow TB Standing with noodle --> W arms S/L shoulder abd raises to tolerance (approx 90-100 degrees) holding towel  Modalities: Ice pack (Lt) shoulder x 10 min  [facility] Adult PT Treatment:                                                DATE: 03/28/2024 Therapeutic Exercise: Shoulder shrugs Bkwd shoulder circles Seated UT & LS stretches  Neuromuscular re-ed: Standing: Scapula retraction with noodle  Unilateral shoulder ER --> added yellow TB  Bilateral shoulder ER + yellow TB Standing shoulder flexion isometric (Lt) Seated shoulder abd isometric against table Therapeutic Activity: Shoulder flexion wall slides: bilateral (no pain) --> Lt only (pain in elbow) Wall clock (Lt) Scaption wall slides Doorway pec stretch with arms low --> did not tolerate Corner pec stretch to tolerance 3x10 Seated: Red therabar twisting  Red therabar frowns/smiles --> wrist pain with frown Open book W arms  Modalities: Moist heat (Lt) shoulder x 10 min  TREATMENT DATE:  [facility] Adult PT Treatment:                                                DATE: 03/19/24 Therapeutic Exercise: Wall slides, scapular retraction, abduction isometric practice reps; HEP handout + education, relevant anatomy/physiology and rationale for interventions  Self Care: Education on exam findings as they relate to symptom behavior, sleep positioning, PT POC    PATIENT EDUCATION: Education details: HEP updated Person educated: Patient Education method: Explanation, Demonstration, Tactile cues, Verbal cues Education comprehension: verbalized understanding, returned demonstration, verbal cues required, tactile cues required, and needs further education    HOME EXERCISE PROGRAM: Access Code: [facility] URL: [email or web address] Date: 03/28/2024 Prepared by: Lamarr Price  Exercises - Shoulder Flexion Wall Slide with Towel  - 2-3 x daily - 1 sets - 8-10 reps - Isometric Shoulder Flexion at Wall  - 2-3 x daily - 1 sets - 8 reps - Isometric Shoulder Abduction at Wall  - 2-3 x daily - 1 sets - 8 reps - Wall Clock  - 1 x daily - 7 x  weekly - 3 sets - 10 reps - Standing Bilateral Shoulder Scaption Wall Slide  - 1 x daily - 7 x weekly - 3 sets - 10 reps - Standing Shoulder External Rotation with Resistance  - 1 x daily - 7 x weekly - 3 sets - 10 reps - Corner Pec Major Stretch  - 2-3 x daily - 1 sets - 3 reps - 10 sec hold  ASSESSMENT:  CLINICAL IMPRESSION: Increased pain along Lt upper arm with pec stretch variations (discontinued). Fatigue noted with side lying shoulder ER and eccentric phase of shoulder abduction. Tactile cue to stabilize scapula during side lying shoulder ER alleviated upper trap tension.  EVAL: Patient is a 73 y.o. woman who was seen today for physical therapy evaluation and treatment for L shoulder pain ongoing over last couple of years. She reports increased pain with usual household and self care tasks. On exam she demonstrates concordant limitations in [facility] mobility/strength, reduced elbow strength, and postural deficits. Increased symptoms gradually through exam but recovers well with rest, does well with HEP but does endorse muscular fatigue (nonpainful). No adverse events. Recommend trial of skilled PT to address aforementioned deficits with aim of improving functional tolerance and reducing pain with typical activities. Pt departs today's session in no acute distress, all voiced concerns/questions addressed appropriately  from PT perspective.      OBJECTIVE IMPAIRMENTS: decreased activity tolerance, decreased endurance, decreased mobility, decreased ROM, decreased strength, hypomobility, impaired perceived functional ability, impaired UE functional use, postural dysfunction, and pain.   ACTIVITY LIMITATIONS: carrying, lifting, standing, reach over head, and hygiene/grooming  PARTICIPATION LIMITATIONS: meal prep, cleaning, laundry, and community activity  PERSONAL FACTORS: Age, Time since onset of injury/illness/exacerbation, and 1-2 comorbidities: GERD, osteoporosis are also affecting patient's  functional outcome.   REHAB POTENTIAL: Good  CLINICAL DECISION MAKING: Evolving/moderate complexity  EVALUATION COMPLEXITY: Moderate   GOALS:  SHORT TERM GOALS: Target date: 04/16/2024  Pt will demonstrate appropriate understanding and performance of initially prescribed HEP in order to facilitate improved independence with management of symptoms.  Baseline: HEP established  Goal status: INITIAL   2. Pt will report at least 25% improvement in overall pain levels over past week in order to facilitate improved tolerance to typical daily activities.   Baseline: 0-9/10  Goal status: INITIAL    LONG TERM GOALS: Target date: 05/14/2024   Pt will score less than or equal to 30% on Quick DASH in order to indicate reduced levels of disability due to shoulder pain (MDC 16-20pts).  Baseline: 47.73%   Goal status: INITIAL  2.  Pt will demonstrate at least 135 degrees of L active shoulder elevation in order to demonstrate improved tolerance to functional movement patterns such as reaching overhead.  Baseline: see ROM chart above Goal status: INITIAL  3.  Pt will demonstrate at least 4+/5 shoulder flex/ER/IR MMT bilaterally for improved symmetry of UE strength and improved tolerance to functional movements.  Baseline: see MMT chart above Goal status: INITIAL  4. Pt will demonstrate ability to manipulate objects <5# at waist level for up to 2 min w/o increase in pain in order to facilitate improved tolerance to usual household tasks.  Baseline: pain with lifting pots/pans, especially repetitively  Goal status: INITIAL   5. Pt will endorse at least 50% increase in sleep quality d/t shoulder pain in order to facilitate improved overall health and QOL.  Baseline: difficulty sleeping  Goal status: INITIAL  6. Pt will report at least 50% decrease in overall pain levels in past week in order to facilitate improved tolerance to basic ADLs/mobility.   Baseline: 0-9/10  Goal status: INITIAL     PLAN:  PT FREQUENCY:1-2x/week  PT DURATION: 8 weeks  PLANNED INTERVENTIONS: 97164- PT Re-evaluation, 97750- Physical Performance Testing, 97110-Therapeutic exercises, 97530- Therapeutic activity, [id number]- Neuromuscular re-education, 97535- Self Care, 02859- Manual therapy, 20560 (1-2 muscles), 20561 (3+ muscles)- Dry Needling, Patient/Family education, Taping, Joint mobilization, Spinal mobilization, Cryotherapy, and Moist heat  PLAN FOR NEXT SESSION: Review/update HEP PRN. Work on [employer] exercises as appropriate with emphasis on GH stability, periscapular strength, cervicothoracic and GH mobility. Symptom modification strategies as indicated/appropriate, mindful of osteoporosis.   Lamarr Price, PTA 04/05/2024 3:34 PM

## 2024-04-17 ENCOUNTER — Ambulatory Visit: Admitting: Physical Therapy

## 2024-04-26 ENCOUNTER — Ambulatory Visit

## 2024-04-26 DIAGNOSIS — M25512 Pain in left shoulder: Secondary | ICD-10-CM | POA: Diagnosis not present

## 2024-04-26 DIAGNOSIS — M6281 Muscle weakness (generalized): Secondary | ICD-10-CM | POA: Diagnosis not present

## 2024-04-26 DIAGNOSIS — G8929 Other chronic pain: Secondary | ICD-10-CM

## 2024-04-26 NOTE — Therapy (Signed)
 OUTPATIENT PHYSICAL THERAPY SHOULDER TREATMENT   Patient Name: Gina Norton MRN: [id number] DOB:[birth year], 73 y.o., female Today's Date: 04/26/2024  END OF SESSION:  PT End of Session - 04/26/24 1321     Visit Number 4    Number of Visits 9    Date for Recertification  05/14/24    Authorization Type [employer]    Authorization Time Period 9 visits approved for PT 03/19/2024-05/14/2024    Authorization - Visit Number 4    Authorization - Number of Visits 9    PT Start Time 1320    PT Stop Time 1413    PT Time Calculation (min) 53 min    Activity Tolerance Patient tolerated treatment well    Behavior During Therapy WFL for tasks assessed/performed         Past Medical History:  Diagnosis Date   GERD (gastroesophageal reflux disease)    History reviewed. No pertinent surgical history. Patient Active Problem List   Diagnosis Date Noted   Left shoulder pain 03/05/2024   Arthritis of left acromioclavicular joint 10/18/2023   Plant dermatitis 10/04/2023   Intertrigo 01/26/2021   Osteoporosis 12/05/2020   IFG (impaired fasting glucose) 10/26/2017   First degree ankle sprain, left, initial encounter 07/23/2016   Family history of premature coronary heart disease 01/02/2013   Rosacea 02/12/2011   Hyperlipidemia 07/03/2010   ELEVATED BLOOD PRESSURE WITHOUT DIAGNOSIS OF HYPERTENSION 07/03/2010   GERD 06/02/2010   CHEST PAIN, ATYPICAL 06/02/2010   Asymptomatic postmenopausal status 06/02/2010    PCP: Alvan Dorothyann BIRCH, MD  REFERRING PROVIDER: Curtis Debby PARAS, MD  REFERRING DIAG: [phone] (ICD-10-CM) - Chronic left shoulder pain  THERAPY DIAG:  Muscle weakness (generalized)  Chronic left shoulder pain  Rationale for Evaluation and Treatment: Rehabilitation  ONSET DATE: about 2 years  SUBJECTIVE:                                                                                                                                                                                       SUBJECTIVE STATEMENT: Patient reports her shoulder is feeling better and her new pillow has also helped. Patient states she has been very busy and has not done HEP for the last week  EVAL: Progressively worsening over last couple years, no MOI or change in activity. States at first she thought she had frozen shoulder but tried to keep using it.  Describes a little bit of numbness in L wrist, pain/tightness in elbow. Sometimes will have L UT and shoulder pain but mostly anterior shoulder into elbow. Does feel that she's lost some strength in her hand. Having difficulty sleeping due to positioning, usually a side  sleeper No R sided pain or numbness. No headaches or dizziness.  Hand dominance: Right  PERTINENT HISTORY: GERD, osteoporosis  PAIN:  Are you having pain: 4/10  Location/description: L shoulder/elbow  Best-worst over past week: 0-9/10 - aggravating factors: reaching with arm, lifting heavy items, prolonged UE use, sleeping  - Easing factors: massage, rest  PRECAUTIONS: osteoporosis   RED FLAGS: None   WEIGHT BEARING RESTRICTIONS: No  FALLS:  Has patient fallen in last 6 months? No  LIVING ENVIRONMENT: Lives w/ husband, kids, and grandkids 2 story home, no issues w/ stairs; main level livable   OCCUPATION: Homemaker   PLOF: Independent   PATIENT GOALS: would like to be able to sew when needed, less pain with usual activities, build up strength and mobility  NEXT MD VISIT: after PT  OBJECTIVE:  Note: Objective measures were completed at Evaluation unless otherwise noted.  DIAGNOSTIC FINDINGS:  10/03/23 L shoulder XR: IMPRESSION: Minor acromioclavicular spurring.  03/05/24 C spine XR: IMPRESSION: Mild to moderate spondylosis of the cervical spine with multilevel disc disease and multilevel neural foraminal narrowing as described. Neural foraminal narrowing worse along the right side of the C4-5 level.  PATIENT SURVEYS:   QuickDASH: 47.73%  COGNITION: Overall cognitive status: Within functional limits for tasks assessed     SENSATION: LT intact BIL UE   POSTURE: Rounded shoulders, UT elevation BIL, FHP  UPPER EXTREMITY ROM:  A/PROM Right eval Left eval  Shoulder flexion 135 deg 130 deg more pain on lowering  Shoulder abduction 130 deg 115 deg  Shoulder internal rotation    Shoulder external rotation    Elbow flexion    Elbow extension    Wrist flexion    Wrist extension     (Blank rows = not tested) (Key: WFL = within functional limits not formally assessed, * = concordant pain, s = stiffness/stretching sensation, NT = not tested)  Comments:    UPPER EXTREMITY MMT:  MMT Right eval Left eval  Shoulder flexion 5 4 *  Shoulder extension    Shoulder abduction 5 4 *  Shoulder extension    Shoulder internal rotation 5 4 *  Shoulder external rotation 5 4 *  Elbow flexion 5 4+  Elbow extension 5 4+   Grip strength 40#/40# 30#/30#  (Blank rows = not tested)  (Key: WFL = within functional limits not formally assessed, * = concordant pain, s = stiffness/stretching sensation, NT = not tested)  Comments:    [facility] Adult PT Treatment:                                                DATE: 04/26/2024 Therapeutic Exercise: Doorway pec stretch --> arms low UT & LS stretches pendulums Neuromuscular re-ed: Supine: Chest lift with bent arm elbow press + scap retraction  Shoulder horizontal abduction + red TB Shoulder extension pull down + red TB Tricep press from 90 degrees + red TB Bent over scap retraction/protraction AROM holding 2#DB Therapeutic Activity: Seated:  Table slides --> shoulder flexion  Low bicep curls + red TB Repeated lifting 3#DB on/off high table Standing: Wall slides --> flexion W arm opn/close --> back against wall Modalities: Ice pack Lt shoulder x 10 min Self Care: Discussion of incorporating HEP throughout day to improve compliance Postural awareness cues; notes  on seated shoulder isometric mechanics   [facility] Adult PT Treatment:  DATE: 04/05/2024 Therapeutic Exercise: Shoulder shrugs Bkwd shoulder circles Seated UT & LS stretches  Corner pec stretch 2x30 --> doorway pec stretch with arm low Lt bicep stretch holding edge of table  Neuromuscular re-ed: Standing with back against noodle: Scap retraction 10x5 Bent arm shoulder ER  --> added yellow TB S/L (Lt) shoulder ER + 1#DB Therapeutic Activity: Shoulder flexion wall (Lt) + tactile cues for scap stability Wall clock --> added yellow TB Standing with noodle --> W arms S/L shoulder abd raises to tolerance (approx 90-100 degrees) holding towel  Modalities: Ice pack (Lt) shoulder x 10 min                                                                                                                [facility] Adult PT Treatment:                                                DATE: 03/28/2024 Therapeutic Exercise: Shoulder shrugs Bkwd shoulder circles Seated UT & LS stretches  Neuromuscular re-ed: Standing: Scapula retraction with noodle  Unilateral shoulder ER --> added yellow TB  Bilateral shoulder ER + yellow TB Standing shoulder flexion isometric (Lt) Seated shoulder abd isometric against table Therapeutic Activity: Shoulder flexion wall slides: bilateral (no pain) --> Lt only (pain in elbow) Wall clock (Lt) Scaption wall slides Doorway pec stretch with arms low --> did not tolerate Corner pec stretch to tolerance 3x10 Seated: Red therabar twisting  Red therabar frowns/smiles --> wrist pain with frown Open book W arms  Modalities: Moist heat (Lt) shoulder x 10 min  TREATMENT DATE:  [facility] Adult PT Treatment:                                                DATE: 03/19/24 Therapeutic Exercise: Wall slides, scapular retraction, abduction isometric practice reps; HEP handout + education, relevant anatomy/physiology and rationale for  interventions  Self Care: Education on exam findings as they relate to symptom behavior, sleep positioning, PT POC    PATIENT EDUCATION: Education details: HEP updated Person educated: Patient Education method: Explanation, Demonstration, Tactile cues, Verbal cues Education comprehension: verbalized understanding, returned demonstration, verbal cues required, tactile cues required, and needs further education    HOME EXERCISE PROGRAM: Access Code: [facility] URL: [email or web address] Date: 04/26/2024 Prepared by: Lamarr Price  Exercises - Shoulder Flexion Wall Slide with Towel  - 2-3 x daily - 1 sets - 8-10 reps - Wall Clock  - 1 x daily - 7 x weekly - 3 sets - 10 reps - Standing Bilateral Shoulder Scaption Wall Slide  - 1 x daily - 7 x weekly - 3 sets - 10 reps - Standing Shoulder External Rotation with Resistance  - 1 x daily - 7 x weekly -  3 sets - 10 reps - Corner Pec Major Stretch  - 2-3 x daily - 1 sets - 3 reps - 10 sec hold - Seated Isometric Shoulder Flexion at Counter  - 1 x daily - 7 x weekly - 3 sets - 10 reps - Seated Isometric Shoulder Abduction at Counter  - 1 x daily - 7 x weekly - 3 sets - 10 reps - Standing Bicep Curls with Resistance  - 1 x daily - 7 x weekly - 3 sets - 10 reps - Standing Anatomical Position with Scapular Retraction and Depression at Wall  - 1 x daily - 7 x weekly - 3 sets - 10 reps - 5 sec hold  ASSESSMENT:  CLINICAL IMPRESSION: Patient presents with forward rounded shoulder posture with lateral trunk flexion towards Lt side; cues provided throughout session to correct posture and patient's proprioceptive awareness. Patient had difficulty maintaining posterior shoulder girdle stabilization during shoulder ER exercises is standing. Patient continues to have aggravation of pain along bicep with doorway pec stretch variations. Moderate increase in UE strength as compared to last PT visit, however patient fatigues quickly with exercises.  Discussion with patient on gradually increasing physical activity throughout the day and encouraged patient to find time for HEP.  EVAL: Patient is a 73 y.o. woman who was seen today for physical therapy evaluation and treatment for L shoulder pain ongoing over last couple of years. She reports increased pain with usual household and self care tasks. On exam she demonstrates concordant limitations in [facility] mobility/strength, reduced elbow strength, and postural deficits. Increased symptoms gradually through exam but recovers well with rest, does well with HEP but does endorse muscular fatigue (nonpainful). No adverse events. Recommend trial of skilled PT to address aforementioned deficits with aim of improving functional tolerance and reducing pain with typical activities. Pt departs today's session in no acute distress, all voiced concerns/questions addressed appropriately from PT perspective.      OBJECTIVE IMPAIRMENTS: decreased activity tolerance, decreased endurance, decreased mobility, decreased ROM, decreased strength, hypomobility, impaired perceived functional ability, impaired UE functional use, postural dysfunction, and pain.   ACTIVITY LIMITATIONS: carrying, lifting, standing, reach over head, and hygiene/grooming  PARTICIPATION LIMITATIONS: meal prep, cleaning, laundry, and community activity  PERSONAL FACTORS: Age, Time since onset of injury/illness/exacerbation, and 1-2 comorbidities: GERD, osteoporosis are also affecting patient's functional outcome.   REHAB POTENTIAL: Good  CLINICAL DECISION MAKING: Evolving/moderate complexity  EVALUATION COMPLEXITY: Moderate   GOALS:  SHORT TERM GOALS: Target date: 04/16/2024  Pt will demonstrate appropriate understanding and performance of initially prescribed HEP in order to facilitate improved independence with management of symptoms.  Baseline: HEP established  Goal status: NOT MET   2. Pt will report at least 25% improvement in overall  pain levels over past week in order to facilitate improved tolerance to typical daily activities.   Baseline: 0-9/10  04/26/24: 4/10  Goal status: MET  LONG TERM GOALS: Target date: 05/14/2024   Pt will score less than or equal to 30% on Quick DASH in order to indicate reduced levels of disability due to shoulder pain (MDC 16-20pts).  Baseline: 47.73%   Goal status: INITIAL  2.  Pt will demonstrate at least 135 degrees of L active shoulder elevation in order to demonstrate improved tolerance to functional movement patterns such as reaching overhead.  Baseline: see ROM chart above Goal status: INITIAL  3.  Pt will demonstrate at least 4+/5 shoulder flex/ER/IR MMT bilaterally for improved symmetry of UE strength and improved tolerance  to functional movements.  Baseline: see MMT chart above Goal status: INITIAL  4. Pt will demonstrate ability to manipulate objects <5# at waist level for up to 2 min w/o increase in pain in order to facilitate improved tolerance to usual household tasks.  Baseline: pain with lifting pots/pans, especially repetitively  Goal status: INITIAL   5. Pt will endorse at least 50% increase in sleep quality d/t shoulder pain in order to facilitate improved overall health and QOL.  Baseline: difficulty sleeping  Goal status: INITIAL  6. Pt will report at least 50% decrease in overall pain levels in past week in order to facilitate improved tolerance to basic ADLs/mobility.   Baseline: 0-9/10  Goal status: INITIAL    PLAN:  PT FREQUENCY:1-2x/week  PT DURATION: 8 weeks  PLANNED INTERVENTIONS: 97164- PT Re-evaluation, 97750- Physical Performance Testing, 97110-Therapeutic exercises, 97530- Therapeutic activity, [id number]- Neuromuscular re-education, 97535- Self Care, [id number]- Manual therapy, 20560 (1-2 muscles), 20561 (3+ muscles)- Dry Needling, Patient/Family education, Taping, Joint mobilization, Spinal mobilization, Cryotherapy, and Moist heat  PLAN FOR NEXT SESSION:  Review/update HEP PRN. Work on [employer] exercises as appropriate with emphasis on GH stability, periscapular strength, cervicothoracic and GH mobility. Symptom modification strategies as indicated/appropriate, mindful of osteoporosis.   Lamarr Price, PTA 04/26/2024 2:18 PM

## 2024-05-10 ENCOUNTER — Ambulatory Visit: Payer: Self-pay | Attending: Family Medicine | Admitting: Rehabilitative and Restorative Service Providers"

## 2024-05-10 ENCOUNTER — Encounter: Payer: Self-pay | Admitting: Rehabilitative and Restorative Service Providers"

## 2024-05-10 DIAGNOSIS — M25512 Pain in left shoulder: Secondary | ICD-10-CM | POA: Insufficient documentation

## 2024-05-10 DIAGNOSIS — G8929 Other chronic pain: Secondary | ICD-10-CM | POA: Insufficient documentation

## 2024-05-10 DIAGNOSIS — M6281 Muscle weakness (generalized): Secondary | ICD-10-CM | POA: Insufficient documentation

## 2024-05-10 NOTE — Therapy (Signed)
 OUTPATIENT PHYSICAL THERAPY SHOULDER TREATMENT   Patient Name: Gina Norton MRN: [id number] DOB:09/27/1950, 73 y.o., female Today's Date: 05/10/2024  END OF SESSION:  PT End of Session - 05/10/24 1150     Visit Number 5    Number of Visits 9    Date for Recertification  05/14/24    Authorization Type [employer]    Authorization Time Period 9 visits approved for PT 03/19/2024-05/14/2024    Authorization - Visit Number 5    Authorization - Number of Visits 9    Progress Note Due on Visit 10    PT Start Time 1145    PT Stop Time 1230    PT Time Calculation (min) 45 min    Activity Tolerance Patient tolerated treatment well         Past Medical History:  Diagnosis Date   GERD (gastroesophageal reflux disease)    History reviewed. No pertinent surgical history. Patient Active Problem List   Diagnosis Date Noted   Left shoulder pain 03/05/2024   Arthritis of left acromioclavicular joint 10/18/2023   Plant dermatitis 10/04/2023   Intertrigo 01/26/2021   Osteoporosis 12/05/2020   IFG (impaired fasting glucose) 10/26/2017   First degree ankle sprain, left, initial encounter 07/23/2016   Family history of premature coronary heart disease 01/02/2013   Rosacea 02/12/2011   Hyperlipidemia 07/03/2010   ELEVATED BLOOD PRESSURE WITHOUT DIAGNOSIS OF HYPERTENSION 07/03/2010   GERD 06/02/2010   CHEST PAIN, ATYPICAL 06/02/2010   Asymptomatic postmenopausal status 06/02/2010    PCP: Alvan Dorothyann BIRCH, MD  REFERRING PROVIDER: Curtis Debby PARAS, MD  REFERRING DIAG: [phone] (ICD-10-CM) - Chronic left shoulder pain  THERAPY DIAG:  Muscle weakness (generalized)  Chronic left shoulder pain  Rationale for Evaluation and Treatment: Rehabilitation  ONSET DATE: about 2 years  SUBJECTIVE:                                                                                                                                                                                       SUBJECTIVE STATEMENT: Patient reports her shoulder is feeling better. She is working on her exercises at home. Still can't find a comfortable position for sleep and will awaken with pain in the shoulder and down into the L arm and hand. Her new pillow has helped some.   EVAL: Progressively worsening over last couple years, no MOI or change in activity. States at first she thought she had frozen shoulder but tried to keep using it.  Describes a little bit of numbness in L wrist, pain/tightness in elbow. Sometimes will have L UT and shoulder pain but mostly anterior shoulder into elbow. Does feel that she's  lost some strength in her hand. Having difficulty sleeping due to positioning, usually a side sleeper No R sided pain or numbness. No headaches or dizziness.  Hand dominance: Right  PERTINENT HISTORY: GERD, osteoporosis  PAIN:  Are you having pain: 0/10  Location/description: L shoulder/elbow  Best-worst over past week: 0-9/10 - aggravating factors: reaching with arm, lifting heavy items, prolonged UE use, sleeping  - Easing factors: massage, rest  PRECAUTIONS: osteoporosis    WEIGHT BEARING RESTRICTIONS: No  FALLS:  Has patient fallen in last 6 months? No  LIVING ENVIRONMENT: Lives w/ husband, kids, and grandkids 2 story home, no issues w/ stairs; main level livable   OCCUPATION: Homemaker   PATIENT GOALS: would like to be able to sew when needed, less pain with usual activities, build up strength and mobility  NEXT MD VISIT: after PT  OBJECTIVE:  Note: Objective measures were completed at Evaluation unless otherwise noted.  DIAGNOSTIC FINDINGS:  10/03/23 L shoulder XR: IMPRESSION: Minor acromioclavicular spurring.  03/05/24 C spine XR: IMPRESSION: Mild to moderate spondylosis of the cervical spine with multilevel disc disease and multilevel neural foraminal narrowing as described. Neural foraminal narrowing worse along the right side of the  C4-5 level.  PATIENT SURVEYS:  QuickDASH: 47.73% 05/10/24: quick DASH - 18.2%     SENSATION: LT intact BIL UE   POSTURE: Rounded shoulders, UT elevation BIL, FHP  UPPER EXTREMITY ROM:  A/PROM Right eval Left eval Left  05/10/24  Shoulder flexion 135 deg 130 deg more pain on lowering 156 No pain  Shoulder abduction 130 deg 115 deg 134  Shoulder internal rotation     Shoulder external rotation     Elbow flexion     Elbow extension     Wrist flexion     Wrist extension      (Blank rows = not tested) (Key: WFL = within functional limits not formally assessed, * = concordant pain, s = stiffness/stretching sensation, NT = not tested)  Comments:    UPPER EXTREMITY MMT:  MMT Right eval Left eval Left  05/10/24  Shoulder flexion 5 4 * 5-  Shoulder extension     Shoulder abduction 5 4 * 5  Shoulder extension     Shoulder internal rotation 5 4 * 5  Shoulder external rotation 5 4 * 5  Elbow flexion 5 4+ 5  Elbow extension 5 4+  5  Grip strength 40#/40# 30#/30#   (Blank rows = not tested)  (Key: WFL = within functional limits not formally assessed, * = concordant pain, s = stiffness/stretching sensation, NT = not tested)  Comments:    [facility] Adult PT Treatment:                                                DATE: 05/10/2024 Therapeutic Exercise: Doorway pec stretch 3 positions 20 sec x 1  Supine prolonged snow angel  Neuromuscular re-ed: Supine: Scap squeeze  Therapeutic Activity: Seated:  Biceps stretch 15-20 sec x 2  Supine: Trunk rotation arms at ~ 70 deg abduction Manual: STM through L pecs pt supine  Self Care: Sleeping positions R sidelying L UE supported   [facility] Adult PT Treatment:  DATE: 04/26/2024 Therapeutic Exercise: Doorway pec stretch --> arms low UT & LS stretches pendulums Neuromuscular re-ed: Supine: Chest lift with bent arm elbow press + scap retraction  Shoulder horizontal abduction + red  TB Shoulder extension pull down + red TB Tricep press from 90 degrees + red TB Bent over scap retraction/protraction AROM holding 2#DB Therapeutic Activity: Seated:  Table slides --> shoulder flexion  Low bicep curls + red TB Repeated lifting 3#DB on/off high table Standing: Wall slides --> flexion W arm opn/close --> back against wall Modalities: Ice pack Lt shoulder x 10 min Self Care: Discussion of incorporating HEP throughout day to improve compliance Postural awareness cues; notes on seated shoulder isometric mechanics   [facility] Adult PT Treatment:                                                DATE: 04/05/2024 Therapeutic Exercise: Shoulder shrugs Bkwd shoulder circles Seated UT & LS stretches  Corner pec stretch 2x30 --> doorway pec stretch with arm low Lt bicep stretch holding edge of table  Neuromuscular re-ed: Standing with back against noodle: Scap retraction 10x5 Bent arm shoulder ER  --> added yellow TB S/L (Lt) shoulder ER + 1#DB Therapeutic Activity: Shoulder flexion wall (Lt) + tactile cues for scap stability Wall clock --> added yellow TB Standing with noodle --> W arms S/L shoulder abd raises to tolerance (approx 90-100 degrees) holding towel  Modalities: Ice pack (Lt) shoulder x 10 min  PATIENT EDUCATION: Education details: HEP updated Person educated: Patient Education method: Explanation, Demonstration, Tactile cues, Verbal cues Education comprehension: verbalized understanding, returned demonstration, verbal cues required, tactile cues required, and needs further education    HOME EXERCISE PROGRAM: Access Code: [facility] URL: [email or web address] Date: 05/10/2024 Prepared by: Iylah Dworkin  Exercises - Shoulder Flexion Wall Slide with Towel  - 2-3 x daily - 1 sets - 8-10 reps - Wall Clock  - 1 x daily - 7 x weekly - 3 sets - 10 reps - Standing Bilateral Shoulder Scaption Wall Slide  - 1 x daily - 7 x weekly - 3 sets - 10 reps -  Standing Shoulder External Rotation with Resistance  - 1 x daily - 7 x weekly - 3 sets - 10 reps - Corner Pec Major Stretch  - 2-3 x daily - 1 sets - 3 reps - 10 sec hold - Seated Isometric Shoulder Flexion at Counter  - 1 x daily - 7 x weekly - 3 sets - 10 reps - Seated Isometric Shoulder Abduction at Counter  - 1 x daily - 7 x weekly - 3 sets - 10 reps - Standing Bicep Curls with Resistance  - 1 x daily - 7 x weekly - 3 sets - 10 reps - Standing Anatomical Position with Scapular Retraction and Depression at Wall  - 1 x daily - 7 x weekly - 3 sets - 10 reps - 5 sec hold - Supine Scapular Retraction  - 2 x daily - 7 x weekly - 1 sets - 10 reps - 5-10 sec  hold - Supine Chest Stretch on Foam Roll  - 2 x daily - 7 x weekly - 1 sets - 1 reps - 2-5 min  sec  hold - Supine Lower Trunk Rotation  - 2 x daily - 7 x weekly - 1 sets - 3-5 reps - 15-200  sec  hold - Anterior Shoulder and Biceps Stretch  - 1 x daily - 7 x weekly - 1 sets - 3 reps - 15--230 sec  hold - Doorway Pec Stretch at 60 Degrees Abduction  - 3 x daily - 7 x weekly - 1 sets - 3 reps - Doorway Pec Stretch at 90 Degrees Abduction  - 3 x daily - 7 x weekly - 1 sets - 3 reps - 30 seconds  hold - Doorway Pec Stretch at 120 Degrees Abduction  - 3 x daily - 7 x weekly - 1 sets - 3 reps - 30 second hold  hold - Standing Backward Shoulder Rolls  - 2 x daily - 7 x weekly - 1 sets - 10 reps - 1-2 sec  hold  ASSESSMENT:  CLINICAL IMPRESSION: Patient presents with forward rounded shoulder posture with lateral trunk flexion towards Lt side; cues provided throughout session to correct posture and patient's proprioceptive awareness. Patient had difficulty maintaining posterior shoulder girdle stabilization during shoulder ER exercises is standing. Patient continues to have aggravation of pain along bicep with doorway pec stretch variations. Moderate increase in UE strength as compared to last PT visit, however patient fatigues quickly with exercises.  Discussion with patient on gradually increasing physical activity throughout the day and encouraged patient to find time for HEP.  EVAL: Patient is a 73 y.o. woman who was seen today for physical therapy evaluation and treatment for L shoulder pain ongoing over last couple of years. She reports increased pain with usual household and self care tasks. On exam she demonstrates concordant limitations in [facility] mobility/strength, reduced elbow strength, and postural deficits. Increased symptoms gradually through exam but recovers well with rest, does well with HEP but does endorse muscular fatigue (nonpainful). No adverse events. Recommend trial of skilled PT to address aforementioned deficits with aim of improving functional tolerance and reducing pain with typical activities. Pt departs today's session in no acute distress, all voiced concerns/questions addressed appropriately from PT perspective.      OBJECTIVE IMPAIRMENTS: decreased activity tolerance, decreased endurance, decreased mobility, decreased ROM, decreased strength, hypomobility, impaired perceived functional ability, impaired UE functional use, postural dysfunction, and pain.       GOALS:  SHORT TERM GOALS: Target date: 04/16/2024  Pt will demonstrate appropriate understanding and performance of initially prescribed HEP in order to facilitate improved independence with management of symptoms.  Baseline: HEP established  Goal status: on going   2. Pt will report at least 25% improvement in overall pain levels over past week in order to facilitate improved tolerance to typical daily activities.   Baseline: 0-9/10  04/26/24: 4/10  Goal status: MET  LONG TERM GOALS: Target date: 05/14/2024   Pt will score less than or equal to 30% on Quick DASH in order to indicate reduced levels of disability due to shoulder pain (MDC 16-20pts).  Baseline: 47.73%   05/10/24: 18.2 %   Goal status: met  2.  Pt will demonstrate at least 135 degrees of L  active shoulder elevation in order to demonstrate improved tolerance to functional movement patterns such as reaching overhead.  Baseline: see ROM chart above Goal status: met  3.  Pt will demonstrate at least 4+/5 shoulder flex/ER/IR MMT bilaterally for improved symmetry of UE strength and improved tolerance to functional movements.  Baseline: see MMT chart above Goal status: met  4. Pt will demonstrate ability to manipulate objects <5# at waist level for up to 2 min w/o increase in pain  in order to facilitate improved tolerance to usual household tasks.  Baseline: pain with lifting pots/pans, especially repetitively  Goal status: on going    5. Pt will endorse at least 50% increase in sleep quality d/t shoulder pain in order to facilitate improved overall health and QOL.  Baseline: difficulty sleeping  Goal status: on going   6. Pt will report at least 50% decrease in overall pain levels in past week in order to facilitate improved tolerance to basic ADLs/mobility.   Baseline: 0-9/10  Goal status: met    PLAN:  PT FREQUENCY:1-2x/week  PT DURATION: 8 weeks  PLANNED INTERVENTIONS: 97164- PT Re-evaluation, 97750- Physical Performance Testing, 97110-Therapeutic exercises, 97530- Therapeutic activity, [id number]- Neuromuscular re-education, 97535- Self Care, [id number]- Manual therapy, 20560 (1-2 muscles), 20561 (3+ muscles)- Dry Needling, Patient/Family education, Taping, Joint mobilization, Spinal mobilization, Cryotherapy, and Moist heat  PLAN FOR NEXT SESSION: Review/update HEP PRN. Work on [employer] exercises as appropriate with emphasis on GH stability, periscapular strength, cervicothoracic and GH mobility. Symptom modification strategies as indicated/appropriate, mindful of osteoporosis.  Re-check in 2-3 weeks - anticipate d/c after next visit    Haruka Kowaleski P. Ina PT, MPH 05/10/24 11:51 AM

## 2024-05-23 DIAGNOSIS — L738 Other specified follicular disorders: Secondary | ICD-10-CM | POA: Diagnosis not present

## 2024-05-23 DIAGNOSIS — L718 Other rosacea: Secondary | ICD-10-CM | POA: Diagnosis not present

## 2024-05-23 DIAGNOSIS — Z129 Encounter for screening for malignant neoplasm, site unspecified: Secondary | ICD-10-CM | POA: Diagnosis not present

## 2024-05-23 DIAGNOSIS — Z85828 Personal history of other malignant neoplasm of skin: Secondary | ICD-10-CM | POA: Diagnosis not present

## 2024-05-23 DIAGNOSIS — L821 Other seborrheic keratosis: Secondary | ICD-10-CM | POA: Diagnosis not present

## 2024-05-23 DIAGNOSIS — L218 Other seborrheic dermatitis: Secondary | ICD-10-CM | POA: Diagnosis not present

## 2024-05-23 DIAGNOSIS — D1801 Hemangioma of skin and subcutaneous tissue: Secondary | ICD-10-CM | POA: Diagnosis not present

## 2024-06-04 ENCOUNTER — Ambulatory Visit: Payer: Self-pay | Attending: Family Medicine | Admitting: Rehabilitative and Restorative Service Providers"

## 2024-06-04 ENCOUNTER — Encounter: Payer: Self-pay | Admitting: Rehabilitative and Restorative Service Providers"

## 2024-06-04 DIAGNOSIS — M6281 Muscle weakness (generalized): Secondary | ICD-10-CM | POA: Insufficient documentation

## 2024-06-04 DIAGNOSIS — M25512 Pain in left shoulder: Secondary | ICD-10-CM | POA: Diagnosis not present

## 2024-06-04 DIAGNOSIS — G8929 Other chronic pain: Secondary | ICD-10-CM | POA: Diagnosis not present

## 2024-06-04 NOTE — Therapy (Signed)
 OUTPATIENT PHYSICAL THERAPY SHOULDER TREATMENT PHYSICAL THERAPY DISCHARGE SUMMARY  Visits from Start of Care: 6  Current functional level related to goals / functional outcomes: See progress note    Remaining deficits: Needs to continue with HEP    Education / Equipment: HEP    Patient agrees to discharge. Patient goals were met. Patient is being discharged due to meeting the stated rehab goals.  Han Lysne P. Ina PT, MPH 06/04/24 3:33 PM   Patient Name: MISCHELLE REEG MRN: [id number] DOB:Jan 26, 1951, 73 y.o., female Today's Date: 06/04/2024  END OF SESSION:  PT End of Session - 06/04/24 1407     Visit Number 6    Number of Visits 9    Date for Recertification  06/04/24    Authorization Type [employer]    Authorization Time Period 9 visits approved for PT 03/19/2024-05/14/2024    Authorization - Visit Number 6    Authorization - Number of Visits 9    Progress Note Due on Visit 10    PT Start Time 1405    PT Stop Time 1445    PT Time Calculation (min) 40 min    Activity Tolerance Patient tolerated treatment well         Past Medical History:  Diagnosis Date   GERD (gastroesophageal reflux disease)    History reviewed. No pertinent surgical history. Patient Active Problem List   Diagnosis Date Noted   Left shoulder pain 03/05/2024   Arthritis of left acromioclavicular joint 10/18/2023   Plant dermatitis 10/04/2023   Intertrigo 01/26/2021   Osteoporosis 12/05/2020   IFG (impaired fasting glucose) 10/26/2017   First degree ankle sprain, left, initial encounter 07/23/2016   Family history of premature coronary heart disease 01/02/2013   Rosacea 02/12/2011   Hyperlipidemia 07/03/2010   ELEVATED BLOOD PRESSURE WITHOUT DIAGNOSIS OF HYPERTENSION 07/03/2010   GERD 06/02/2010   CHEST PAIN, ATYPICAL 06/02/2010   Asymptomatic postmenopausal status 06/02/2010    PCP: Alvan Dorothyann BIRCH, MD  REFERRING PROVIDER: Curtis Debby PARAS, MD  REFERRING DIAG:  [phone] (ICD-10-CM) - Chronic left shoulder pain  THERAPY DIAG:  Muscle weakness (generalized)  Chronic left shoulder pain  Rationale for Evaluation and Treatment: Rehabilitation  ONSET DATE: about 2 years  SUBJECTIVE:                                                                                                                                                                                      SUBJECTIVE STATEMENT: Patient reports her shoulder is so much better following last treatment. Having minimal pain in the L shoulder and is sleeping much better. She is working on her exercises some at home. Now sleeping  in a comfortable position for R side but wants to sleep on L at times. Feels she is ready for discharge and understands the importance of continuing with HEP on a consistent basis.   EVAL: Progressively worsening over last couple years, no MOI or change in activity. States at first she thought she had frozen shoulder but tried to keep using it.  Describes a little bit of numbness in L wrist, pain/tightness in elbow. Sometimes will have L UT and shoulder pain but mostly anterior shoulder into elbow. Does feel that she's lost some strength in her hand. Having difficulty sleeping due to positioning, usually a side sleeper No R sided pain or numbness. No headaches or dizziness.  Hand dominance: Right  PERTINENT HISTORY: GERD, osteoporosis  PAIN:  Are you having pain: 0/10  Location/description: L shoulder/elbow  Best-worst over past week: 0-9/10 - aggravating factors: reaching with arm, lifting heavy items, prolonged UE use, sleeping  - Easing factors: massage, rest  PRECAUTIONS: osteoporosis    WEIGHT BEARING RESTRICTIONS: No  FALLS:  Has patient fallen in last 6 months? No  LIVING ENVIRONMENT: Lives w/ husband, kids, and grandkids 2 story home, no issues w/ stairs; main level livable   OCCUPATION: Homemaker   PATIENT GOALS: would like to be able to sew when  needed, less pain with usual activities, build up strength and mobility  NEXT MD VISIT: after PT  OBJECTIVE:  Note: Objective measures were completed at Evaluation unless otherwise noted.  DIAGNOSTIC FINDINGS:  10/03/23 L shoulder XR: IMPRESSION: Minor acromioclavicular spurring.  03/05/24 C spine XR: IMPRESSION: Mild to moderate spondylosis of the cervical spine with multilevel disc disease and multilevel neural foraminal narrowing as described. Neural foraminal narrowing worse along the right side of the C4-5 level.  PATIENT SURVEYS:  QuickDASH: 47.73% 05/10/24: quick DASH - 18.2%     SENSATION: LT intact BIL UE   POSTURE: Rounded shoulders, UT elevation BIL, FHP  UPPER EXTREMITY ROM:  A/PROM Right eval Left eval Left  05/10/24 Left  06/04/24  Shoulder flexion 135 deg 130 deg more pain on lowering 156 No pain 156 No pain   Shoulder abduction 130 deg 115 deg 134 135  Shoulder internal rotation      Shoulder external rotation      Elbow flexion      Elbow extension      Wrist flexion      Wrist extension       (Blank rows = not tested) (Key: WFL = within functional limits not formally assessed, * = concordant pain, s = stiffness/stretching sensation, NT = not tested)  Comments:    UPPER EXTREMITY MMT:  MMT Right eval Left eval Left  05/10/24 Left  06/04/24  Shoulder flexion 5 4 * 5- 5  Shoulder extension      Shoulder abduction 5 4 * 5 5  Shoulder extension      Shoulder internal rotation 5 4 * 5 5  Shoulder external rotation 5 4 * 5 5  Elbow flexion 5 4+ 5 5  Elbow extension 5 4+  5 5  Grip strength 40#/40# 30#/30#    (Blank rows = not tested)  (Key: WFL = within functional limits not formally assessed, * = concordant pain, s = stiffness/stretching sensation, NT = not tested)  Comments:    [facility] Adult PT Treatment:  DATE: 06/04/2024 Therapeutic Exercise: Doorway pec stretch 3 positions 20 sec x 1  Supine  prolonged snow angel  Neuromuscular re-ed: Supine: Scap squeeze with noodle  Scap squeeze with ER with noodle  Scap squeeze with ER red TB with noodle  W with noodle  Therapeutic Activity: Seated:  Biceps stretch 15-20 sec x 2  Thoracic extension with coregeous ball T spine  Supine: Trunk rotation arms at ~ 70 deg abduction Manual: STM through L pecs pt supine  Myofacial ball release work standing  Self Care: Sleeping positions L sidelying L UE supported  Stressed importance of consistent exercise    [facility] Adult PT Treatment:                                                DATE: 05/10/2024 Therapeutic Exercise: Doorway pec stretch 3 positions 20 sec x 1  Supine prolonged snow angel  Neuromuscular re-ed: Supine: Scap squeeze  Therapeutic Activity: Seated:  Biceps stretch 15-20 sec x 2  Supine: Trunk rotation arms at ~ 70 deg abduction Manual: STM through L pecs pt supine  Self Care: Sleeping positions R sidelying L UE supported   [facility] Adult PT Treatment:                                                DATE: 04/26/2024 Therapeutic Exercise: Doorway pec stretch --> arms low UT & LS stretches pendulums Neuromuscular re-ed: Supine: Chest lift with bent arm elbow press + scap retraction  Shoulder horizontal abduction + red TB Shoulder extension pull down + red TB Tricep press from 90 degrees + red TB Bent over scap retraction/protraction AROM holding 2#DB Therapeutic Activity: Seated:  Table slides --> shoulder flexion  Low bicep curls + red TB Repeated lifting 3#DB on/off high table Standing: Wall slides --> flexion W arm opn/close --> back against wall Modalities: Ice pack Lt shoulder x 10 min Self Care: Discussion of incorporating HEP throughout day to improve compliance Postural awareness cues; notes on seated shoulder isometric mechanics   [facility] Adult PT Treatment:                                                DATE: 04/05/2024 Therapeutic Exercise: Shoulder  shrugs Bkwd shoulder circles Seated UT & LS stretches  Corner pec stretch 2x30 --> doorway pec stretch with arm low Lt bicep stretch holding edge of table  Neuromuscular re-ed: Standing with back against noodle: Scap retraction 10x5 Bent arm shoulder ER  --> added yellow TB S/L (Lt) shoulder ER + 1#DB Therapeutic Activity: Shoulder flexion wall (Lt) + tactile cues for scap stability Wall clock --> added yellow TB Standing with noodle --> W arms S/L shoulder abd raises to tolerance (approx 90-100 degrees) holding towel  Modalities: Ice pack (Lt) shoulder x 10 min  PATIENT EDUCATION: Education details: HEP updated Person educated: Patient Education method: Explanation, Demonstration, Tactile cues, Verbal cues Education comprehension: verbalized understanding, returned demonstration, verbal cues required, tactile cues required, and needs further education    HOME EXERCISE PROGRAM: Access Code: [facility] URL: [email or web address] Date: 06/04/2024 Prepared by: Keen Ewalt  Jaxyn Mestas  Exercises - Shoulder Flexion Wall Slide with Towel  - 2-3 x daily - 1 sets - 8-10 reps - Wall Clock  - 1 x daily - 7 x weekly - 3 sets - 10 reps - Standing Bilateral Shoulder Scaption Wall Slide  - 1 x daily - 7 x weekly - 3 sets - 10 reps - Standing Shoulder External Rotation with Resistance  - 1 x daily - 7 x weekly - 3 sets - 10 reps - Corner Pec Major Stretch  - 2-3 x daily - 1 sets - 3 reps - 10 sec hold - Seated Isometric Shoulder Flexion at Counter  - 1 x daily - 7 x weekly - 3 sets - 10 reps - Seated Isometric Shoulder Abduction at Counter  - 1 x daily - 7 x weekly - 3 sets - 10 reps - Standing Bicep Curls with Resistance  - 1 x daily - 7 x weekly - 3 sets - 10 reps - Standing Anatomical Position with Scapular Retraction and Depression at Wall  - 1 x daily - 7 x weekly - 3 sets - 10 reps - 5 sec hold - Supine Scapular Retraction  - 2 x daily - 7 x weekly - 1 sets - 10 reps - 5-10 sec   hold - Supine Chest Stretch on Foam Roll  - 2 x daily - 7 x weekly - 1 sets - 1 reps - 2-5 min  sec  hold - Supine Lower Trunk Rotation  - 2 x daily - 7 x weekly - 1 sets - 3-5 reps - 15-200 sec  hold - Anterior Shoulder and Biceps Stretch  - 1 x daily - 7 x weekly - 1 sets - 3 reps - 15--230 sec  hold - Doorway Pec Stretch at 60 Degrees Abduction  - 3 x daily - 7 x weekly - 1 sets - 3 reps - Doorway Pec Stretch at 90 Degrees Abduction  - 3 x daily - 7 x weekly - 1 sets - 3 reps - 30 seconds  hold - Doorway Pec Stretch at 120 Degrees Abduction  - 3 x daily - 7 x weekly - 1 sets - 3 reps - 30 second hold  hold - Standing Backward Shoulder Rolls  - 2 x daily - 7 x weekly - 1 sets - 10 reps - 1-2 sec  hold - Seated Scapular Retraction  - 2 x daily - 7 x weekly - 1-2 sets - 10 reps - 10 sec  hold - Shoulder External Rotation and Scapular Retraction  - 1-2 x daily - 7 x weekly - 1 sets - 10 reps - 3-5 sec   hold - Shoulder External Rotation and Scapular Retraction with Resistance  - 2 x daily - 7 x weekly - 1 sets - 10 reps - 3-5 sec  hold - Standing Shoulder W at Wall  - 1-2 x daily - 7 x weekly - 1 sets - 10 reps - 3 sec  hold - Shoulder W - External Rotation with Resistance  - 2 x daily - 7 x weekly - 1-2 sets - 10 reps - 3 sec  hold  ASSESSMENT:  CLINICAL IMPRESSION: Good improvement in L shoulder pain with patient reporting no pain for the past several days. She is sleeping better and notes improved functional strength. Patient continues with forward rounded shoulder posture with lateral trunk flexion towards Lt side; cues provided throughout session to correct posture and patient's proprioceptive awareness. Patient had  difficulty maintaining posterior shoulder girdle stabilization during shoulder ER exercises is standing. Patient continues to have aggravation of pain along bicep with doorway pec stretch variations. Patient has accomplished most of PT goals(see below).Discussion with patient on  gradually increasing physical activity throughout the day and encouraged patient to find time for HEP. Focused on the importance os stretching and strengthening.  EVAL: Patient is a 73 y.o. woman who was seen today for physical therapy evaluation and treatment for L shoulder pain ongoing over last couple of years. She reports increased pain with usual household and self care tasks. On exam she demonstrates concordant limitations in [facility] mobility/strength, reduced elbow strength, and postural deficits. Increased symptoms gradually through exam but recovers well with rest, does well with HEP but does endorse muscular fatigue (nonpainful). No adverse events. Recommend trial of skilled PT to address aforementioned deficits with aim of improving functional tolerance and reducing pain with typical activities. Pt departs today's session in no acute distress, all voiced concerns/questions addressed appropriately from PT perspective.      OBJECTIVE IMPAIRMENTS: decreased activity tolerance, decreased endurance, decreased mobility, decreased ROM, decreased strength, hypomobility, impaired perceived functional ability, impaired UE functional use, postural dysfunction, and pain.       GOALS:  SHORT TERM GOALS: Target date: 04/16/2024  Pt will demonstrate appropriate understanding and performance of initially prescribed HEP in order to facilitate improved independence with management of symptoms.  Baseline: HEP established  Goal status: met    2. Pt will report at least 25% improvement in overall pain levels over past week in order to facilitate improved tolerance to typical daily activities.   Baseline: 0-9/10  04/26/24: 4/10  Goal status: MET  LONG TERM GOALS: Target date: 06/04/2024   Pt will score less than or equal to 30% on Quick DASH in order to indicate reduced levels of disability due to shoulder pain (MDC 16-20pts).  Baseline: 47.73%   05/10/24: 18.2 %   Goal status: met  2.  Pt will demonstrate  at least 135 degrees of L active shoulder elevation in order to demonstrate improved tolerance to functional movement patterns such as reaching overhead.  Baseline: see ROM chart above Goal status: met  3.  Pt will demonstrate at least 4+/5 shoulder flex/ER/IR MMT bilaterally for improved symmetry of UE strength and improved tolerance to functional movements.  Baseline: see MMT chart above Goal status: met  4. Pt will demonstrate ability to manipulate objects <5# at waist level for up to 2 min w/o increase in pain in order to facilitate improved tolerance to usual household tasks.  Baseline: pain with lifting pots/pans, especially repetitively  Goal status: partially met     5. Pt will endorse at least 50% increase in sleep quality d/t shoulder pain in order to facilitate improved overall health and QOL.  Baseline: sleeping much improved   Goal status: met   6. Pt will report at least 50% decrease in overall pain levels in past week in order to facilitate improved tolerance to basic ADLs/mobility.   Baseline: 0-9/10  Goal status: met    PLAN:  PT FREQUENCY:1-2x/week  PT DURATION: 8 weeks  PLANNED INTERVENTIONS: 97164- PT Re-evaluation, 97750- Physical Performance Testing, 97110-Therapeutic exercises, 97530- Therapeutic activity, [id number]- Neuromuscular re-education, 97535- Self Care, [id number]- Manual therapy, 20560 (1-2 muscles), 20561 (3+ muscles)- Dry Needling, Patient/Family education, Taping, Joint mobilization, Spinal mobilization, Cryotherapy, and Moist heat  PLAN FOR NEXT SESSION: d/c to independent HEP    Carmel Waddington P. Ina PT, MPH 06/04/24 3:31  PM
